# Patient Record
Sex: Male | Born: 1950 | Race: White | Hispanic: No | Marital: Single | Smoking: Current every day smoker
Health system: Southern US, Community
[De-identification: ages and names within clinical notes are randomized; demographics above are authoritative.]

## PROBLEM LIST (undated history)

## (undated) DIAGNOSIS — K219 Gastro-esophageal reflux disease without esophagitis: Secondary | ICD-10-CM

## (undated) DIAGNOSIS — E785 Hyperlipidemia, unspecified: Secondary | ICD-10-CM

## (undated) DIAGNOSIS — M549 Dorsalgia, unspecified: Secondary | ICD-10-CM

## (undated) DIAGNOSIS — J449 Chronic obstructive pulmonary disease, unspecified: Secondary | ICD-10-CM

## (undated) DIAGNOSIS — I10 Essential (primary) hypertension: Secondary | ICD-10-CM

## (undated) HISTORY — PX: HERNIA REPAIR: SHX51

---

## 2015-07-21 ENCOUNTER — Encounter: Payer: Self-pay | Admitting: *Deleted

## 2015-07-21 ENCOUNTER — Emergency Department
Admission: EM | Admit: 2015-07-21 | Discharge: 2015-07-21 | Disposition: A | Discharge status: Home / Self Care | Payer: Medicare Other | Attending: Emergency Medicine | Admitting: Emergency Medicine

## 2015-07-21 DIAGNOSIS — Y998 Other external cause status: Secondary | ICD-10-CM | POA: Insufficient documentation

## 2015-07-21 DIAGNOSIS — W1839XA Other fall on same level, initial encounter: Secondary | ICD-10-CM | POA: Insufficient documentation

## 2015-07-21 DIAGNOSIS — R42 Dizziness and giddiness: Secondary | ICD-10-CM | POA: Insufficient documentation

## 2015-07-21 DIAGNOSIS — I10 Essential (primary) hypertension: Secondary | ICD-10-CM | POA: Insufficient documentation

## 2015-07-21 DIAGNOSIS — Z720 Tobacco use: Secondary | ICD-10-CM | POA: Insufficient documentation

## 2015-07-21 DIAGNOSIS — Z880 Allergy status to penicillin: Secondary | ICD-10-CM | POA: Insufficient documentation

## 2015-07-21 DIAGNOSIS — Y9289 Other specified places as the place of occurrence of the external cause: Secondary | ICD-10-CM | POA: Insufficient documentation

## 2015-07-21 DIAGNOSIS — Y9389 Activity, other specified: Secondary | ICD-10-CM | POA: Insufficient documentation

## 2015-07-21 DIAGNOSIS — R531 Weakness: Secondary | ICD-10-CM | POA: Insufficient documentation

## 2015-07-21 DIAGNOSIS — S299XXA Unspecified injury of thorax, initial encounter: Secondary | ICD-10-CM | POA: Insufficient documentation

## 2015-07-21 HISTORY — DX: Gastro-esophageal reflux disease without esophagitis: K21.9

## 2015-07-21 HISTORY — DX: Chronic obstructive pulmonary disease, unspecified: J44.9

## 2015-07-21 HISTORY — DX: Essential (primary) hypertension: I10

## 2015-07-21 HISTORY — DX: Dorsalgia, unspecified: M54.9

## 2015-07-21 LAB — CBC
HCT: 47.3 % (ref 40.0–52.0)
Hemoglobin: 16 g/dL (ref 13.0–18.0)
MCH: 29.9 pg (ref 26.0–34.0)
MCHC: 33.9 g/dL (ref 32.0–36.0)
MCV: 88.3 fL (ref 80.0–100.0)
Platelets: 242 10*3/uL (ref 150–440)
RBC: 5.36 MIL/uL (ref 4.40–5.90)
RDW: 15.6 % — ABNORMAL HIGH (ref 11.5–14.5)
WBC: 11 10*3/uL — ABNORMAL HIGH (ref 3.8–10.6)

## 2015-07-21 LAB — BASIC METABOLIC PANEL
Anion gap: 12 (ref 5–15)
BUN: 17 mg/dL (ref 6–20)
CO2: 23 mmol/L (ref 22–32)
Calcium: 9.5 mg/dL (ref 8.9–10.3)
Chloride: 103 mmol/L (ref 101–111)
Creatinine, Ser: 0.83 mg/dL (ref 0.61–1.24)
GFR calc Af Amer: >60 mL/min (ref >60)
GFR calc non Af Amer: >60 mL/min (ref >60)
Glucose, Bld: 182 mg/dL — ABNORMAL HIGH (ref 65–99)
Potassium: 4.1 mmol/L (ref 3.5–5.1)
Sodium: 138 mmol/L (ref 135–145)

## 2015-07-21 LAB — TROPONIN I: Troponin I: <0.03 ng/mL (ref <0.031)

## 2015-07-21 MED ORDER — SODIUM CHLORIDE 0.9 % IV SOLN
Freq: Once | INTRAVENOUS | Status: AC
Start: 2015-07-21 — End: 2015-07-21
  Administered 2015-07-21: 20:00:00 via INTRAVENOUS

## 2015-07-21 NOTE — ED Notes (Signed)
Pt reports to ED with reports of dizziness, lightheadedness, chest pain, and episodes of falling since Saturday. Pt has unsteady gait with use of cane. Pt states hx of acid reflux, smoking 0.5 pack of cigarettes a day.

## 2015-07-21 NOTE — ED Notes (Signed)
States he feels like he is going to pass out since last pm, is homeless, diarrhea yesterday, chest heaviness

## 2015-07-21 NOTE — ED Notes (Signed)
Pt provided meal and drinks.

## 2015-07-21 NOTE — Discharge Instructions (Signed)

## 2015-07-21 NOTE — ED Provider Notes (Signed)
[facility] Emergency Department Provider Note     Time seen: ----------------------------------------- 7:52 PM on 07/21/2015 -----------------------------------------    I have reviewed the triage vital signs and the nursing notes.   HISTORY  Chief Complaint Near Syncope    HPI Kenneth Patel is a 64 y.o. male who presents ER with dizziness lightheadedness chest pain and falling since Saturday. Patient has unsteady gait with his history and use a cane. He is currently homeless, visiting from [place]. Has a history of acid reflux and smokes half pack of cigarettes a day. Patient also admits he has not eaten anything today.   Past Medical History  Diagnosis Date  . GERD (gastroesophageal reflux disease)   . Hypertension   . Back ache   . COPD (chronic obstructive pulmonary disease) (HCC)     There are no active problems to display for this patient.   Past Surgical History  Procedure Laterality Date  . Hernia repair      Allergies Penicillins  Social History Social History  Substance Use Topics  . Smoking status: Current Every Day Smoker  . Smokeless tobacco: None  . Alcohol Use: No    Review of Systems Constitutional: Negative for fever. Eyes: Negative for visual changes. ENT: Negative for sore throat. Cardiovascular: Positive for chest pain Respiratory: Negative for shortness of breath. Gastrointestinal: Negative for abdominal pain, vomiting and diarrhea. Genitourinary: Negative for dysuria. Musculoskeletal: Negative for back pain. Skin: Negative for rash. Neurological: Negative for headaches, positive for weakness  10-point ROS otherwise negative.  ____________________________________________   PHYSICAL EXAM:  VITAL SIGNS: ED Triage Vitals  Enc Vitals Group     BP 07/21/15 1857 126/109 mmHg     Pulse Rate 07/21/15 1857 99     Resp 07/21/15 1857 24     Temp 07/21/15 1857 98.2 F (36.8 C)     Temp Source 07/21/15 1857  Oral     SpO2 07/21/15 1857 96 %     Weight 07/21/15 1857 200 lb (90.719 kg)     Height 07/21/15 1857 5\' 6"  (1.676 m)     Head Cir --      Peak Flow --      Pain Score 07/21/15 1859 5     Pain Loc --      Pain Edu? --      Excl. in GC? --     Constitutional: Alert and oriented. Well appearing and in no distress. Eyes: Conjunctivae are normal. PERRL. Normal extraocular movements. ENT   Head: Normocephalic and atraumatic.   Nose: No congestion/rhinnorhea.   Mouth/Throat: Mucous membranes are moist.   Neck: No stridor. Cardiovascular: Normal rate, regular rhythm. Normal and symmetric distal pulses are present in all extremities. No murmurs, rubs, or gallops. Respiratory: Normal respiratory effort without tachypnea nor retractions. Breath sounds are clear and equal bilaterally. No wheezes/rales/rhonchi. Gastrointestinal: Soft and nontender. No distention. No abdominal bruits.  Musculoskeletal: Nontender with normal range of motion in all extremities. No joint effusions.  No lower extremity tenderness nor edema. Neurologic:  Normal speech and language. No gross focal neurologic deficits are appreciated. Speech is normal. No gait instability. Skin:  Skin is warm, dry and intact. No rash noted. Psychiatric: Mood and affect are normal. Speech and behavior are normal. Patient exhibits appropriate insight and judgment. ____________________________________________  EKG: Interpreted by me. Normal sinus rhythm with a rate of 95 bpm, normal PR interval, normal QS with, normal QT interval. Nonspecific T-wave changes.  ____________________________________________  ED COURSE:  Pertinent  labs & imaging results that were available during my care of the patient were reviewed by me and considered in my medical decision making (see chart for details). Patient is in no acute distress, will check basic labs, given a liter of saline and given something to  eat. ____________________________________________    LABS (pertinent positives/negatives)  Labs Reviewed  BASIC METABOLIC PANEL - Abnormal; Notable for the following:    Glucose, Bld 182 (*)    All other components within normal limits  CBC - Abnormal; Notable for the following:    WBC 11.0 (*)    RDW 15.6 (*)    All other components within normal limits  TROPONIN I  URINALYSIS COMPLETEWITH MICROSCOPIC ([facility] ONLY)  ____________________________________________  FINAL ASSESSMENT AND PLAN  Weakness  Plan: Patient with labs as dictated above. Patient was given saline and something to eat. Labs as dictated above. Patient is feeling better, will be discharged with a cab voucher to the [facility] shelter.   Emily Filbert, MD   Emily Filbert, MD 07/21/15 2100

## 2015-07-21 NOTE — ED Notes (Signed)

## 2015-07-22 ENCOUNTER — Encounter (HOSPITAL_COMMUNITY): Payer: Self-pay

## 2015-07-22 ENCOUNTER — Observation Stay (HOSPITAL_COMMUNITY)
Admission: EM | Admit: 2015-07-22 | Discharge: 2015-07-23 | Discharge status: Against Medical Advice | Payer: Medicare Other | Attending: Internal Medicine | Admitting: Internal Medicine

## 2015-07-22 ENCOUNTER — Emergency Department (HOSPITAL_COMMUNITY): Payer: Medicare Other

## 2015-07-22 DIAGNOSIS — E785 Hyperlipidemia, unspecified: Secondary | ICD-10-CM | POA: Diagnosis not present

## 2015-07-22 DIAGNOSIS — M549 Dorsalgia, unspecified: Secondary | ICD-10-CM | POA: Insufficient documentation

## 2015-07-22 DIAGNOSIS — K921 Melena: Secondary | ICD-10-CM | POA: Diagnosis not present

## 2015-07-22 DIAGNOSIS — G8929 Other chronic pain: Secondary | ICD-10-CM | POA: Insufficient documentation

## 2015-07-22 DIAGNOSIS — Z23 Encounter for immunization: Secondary | ICD-10-CM | POA: Insufficient documentation

## 2015-07-22 DIAGNOSIS — Z880 Allergy status to penicillin: Secondary | ICD-10-CM | POA: Diagnosis not present

## 2015-07-22 DIAGNOSIS — R0789 Other chest pain: Secondary | ICD-10-CM

## 2015-07-22 DIAGNOSIS — J449 Chronic obstructive pulmonary disease, unspecified: Secondary | ICD-10-CM | POA: Diagnosis not present

## 2015-07-22 DIAGNOSIS — F17200 Nicotine dependence, unspecified, uncomplicated: Secondary | ICD-10-CM

## 2015-07-22 DIAGNOSIS — J189 Pneumonia, unspecified organism: Secondary | ICD-10-CM | POA: Diagnosis not present

## 2015-07-22 DIAGNOSIS — K219 Gastro-esophageal reflux disease without esophagitis: Secondary | ICD-10-CM | POA: Insufficient documentation

## 2015-07-22 DIAGNOSIS — I10 Essential (primary) hypertension: Secondary | ICD-10-CM | POA: Diagnosis not present

## 2015-07-22 DIAGNOSIS — Z5900 Homelessness unspecified: Secondary | ICD-10-CM

## 2015-07-22 DIAGNOSIS — Z5321 Procedure and treatment not carried out due to patient leaving prior to being seen by health care provider: Secondary | ICD-10-CM

## 2015-07-22 HISTORY — DX: Hyperlipidemia, unspecified: E78.5

## 2015-07-22 LAB — CBC WITH DIFFERENTIAL/PLATELET
Basophils Absolute: 0 10*3/uL (ref 0.0–0.1)
Basophils Relative: 0 %
EOS ABS: 0.1 10*3/uL (ref 0.0–0.7)
Eosinophils Relative: 0 %
HEMATOCRIT: 46.1 % (ref 39.0–52.0)
HEMOGLOBIN: 15.1 g/dL (ref 13.0–17.0)
LYMPHS ABS: 2.2 10*3/uL (ref 0.7–4.0)
Lymphocytes Relative: 14 %
MCH: 28.9 pg (ref 26.0–34.0)
MCHC: 32.8 g/dL (ref 30.0–36.0)
MCV: 88.3 fL (ref 78.0–100.0)
MONOS PCT: 8 %
Monocytes Absolute: 1.3 10*3/uL — ABNORMAL HIGH (ref 0.1–1.0)
NEUTROS ABS: 12.6 10*3/uL — AB (ref 1.7–7.7)
NEUTROS PCT: 78 %
Platelets: 205 10*3/uL (ref 150–400)
RBC: 5.22 MIL/uL (ref 4.22–5.81)
RDW: 15.4 % (ref 11.5–15.5)
WBC: 16.3 10*3/uL — ABNORMAL HIGH (ref 4.0–10.5)

## 2015-07-22 LAB — HEPATIC FUNCTION PANEL
ALBUMIN: 3.6 g/dL (ref 3.5–5.0)
ALK PHOS: 74 U/L (ref 38–126)
ALT: 21 U/L (ref 17–63)
AST: 28 U/L (ref 15–41)
BILIRUBIN TOTAL: 0.6 mg/dL (ref 0.3–1.2)
Bilirubin, Direct: <0.1 mg/dL — ABNORMAL LOW (ref 0.1–0.5)
TOTAL PROTEIN: 6.1 g/dL — AB (ref 6.5–8.1)

## 2015-07-22 LAB — RAPID URINE DRUG SCREEN, HOSP PERFORMED
AMPHETAMINES: NOT DETECTED
BARBITURATES: NOT DETECTED
Benzodiazepines: NOT DETECTED
Cocaine: NOT DETECTED
Opiates: NOT DETECTED
TETRAHYDROCANNABINOL: NOT DETECTED

## 2015-07-22 LAB — TROPONIN I

## 2015-07-22 LAB — BASIC METABOLIC PANEL
ANION GAP: 9 (ref 5–15)
BUN: 14 mg/dL (ref 6–20)
CHLORIDE: 105 mmol/L (ref 101–111)
CO2: 24 mmol/L (ref 22–32)
CREATININE: 0.79 mg/dL (ref 0.61–1.24)
Calcium: 9.4 mg/dL (ref 8.9–10.3)
GFR calc non Af Amer: >60 mL/min (ref >60)
Glucose, Bld: 101 mg/dL — ABNORMAL HIGH (ref 65–99)
POTASSIUM: 4 mmol/L (ref 3.5–5.1)
SODIUM: 138 mmol/L (ref 135–145)

## 2015-07-22 LAB — PROCALCITONIN

## 2015-07-22 LAB — POC OCCULT BLOOD, ED: FECAL OCCULT BLD: NEGATIVE

## 2015-07-22 LAB — PHOSPHORUS: PHOSPHORUS: 2.8 mg/dL (ref 2.5–4.6)

## 2015-07-22 LAB — MAGNESIUM: MAGNESIUM: 2.1 mg/dL (ref 1.7–2.4)

## 2015-07-22 MED ORDER — DEXTROSE 5 % IV SOLN
1.0000 g | INTRAVENOUS | Status: DC
  Filled 2015-07-22: qty 10

## 2015-07-22 MED ORDER — INFLUENZA VAC SPLIT QUAD 0.5 ML IM SUSY
0.5000 mL | PREFILLED_SYRINGE | INTRAMUSCULAR | Status: DC
  Filled 2015-07-22: qty 0.5

## 2015-07-22 MED ORDER — DEXTROSE 5 % IV SOLN
500.0000 mg | INTRAVENOUS | Status: DC
  Filled 2015-07-22: qty 500

## 2015-07-22 MED ORDER — ACETAMINOPHEN 325 MG PO TABS
650.0000 mg | ORAL_TABLET | Freq: Four times a day (QID) | ORAL | Status: DC | PRN
  Administered 2015-07-22: 650 mg via ORAL
  Filled 2015-07-22: qty 2

## 2015-07-22 MED ORDER — PANTOPRAZOLE SODIUM 40 MG PO TBEC
40.0000 mg | DELAYED_RELEASE_TABLET | Freq: Two times a day (BID) | ORAL | Status: DC
  Administered 2015-07-22 – 2015-07-23 (×3): 40 mg via ORAL
  Filled 2015-07-22 (×3): qty 1

## 2015-07-22 MED ORDER — DEXTROSE 5 % IV SOLN
500.0000 mg | Freq: Once | INTRAVENOUS | Status: AC
  Administered 2015-07-22: 500 mg via INTRAVENOUS
  Filled 2015-07-22: qty 500

## 2015-07-22 MED ORDER — PNEUMOCOCCAL VAC POLYVALENT 25 MCG/0.5ML IJ INJ
0.5000 mL | INJECTION | INTRAMUSCULAR | Status: DC
  Filled 2015-07-22 (×2): qty 0.5

## 2015-07-22 MED ORDER — ACETAMINOPHEN 650 MG RE SUPP: 650.0000 mg | Freq: Four times a day (QID) | RECTAL | Status: DC | PRN

## 2015-07-22 MED ORDER — VITAMIN B-1 100 MG PO TABS
100.0000 mg | ORAL_TABLET | Freq: Every day | ORAL | Status: DC
  Administered 2015-07-22: 100 mg via ORAL
  Filled 2015-07-22: qty 1

## 2015-07-22 MED ORDER — CEFTRIAXONE SODIUM 1 G IJ SOLR
1.0000 g | Freq: Once | INTRAMUSCULAR | Status: AC
  Administered 2015-07-22: 1 g via INTRAVENOUS
  Filled 2015-07-22: qty 10

## 2015-07-22 MED ORDER — FOLIC ACID 1 MG PO TABS
1.0000 mg | ORAL_TABLET | Freq: Every day | ORAL | Status: DC
  Administered 2015-07-22: 1 mg via ORAL
  Filled 2015-07-22: qty 1

## 2015-07-22 MED ORDER — ENOXAPARIN SODIUM 40 MG/0.4ML ~~LOC~~ SOLN
40.0000 mg | SUBCUTANEOUS | Status: DC
  Administered 2015-07-22: 40 mg via SUBCUTANEOUS
  Filled 2015-07-22: qty 0.4

## 2015-07-22 MED ORDER — ALBUTEROL SULFATE (2.5 MG/3ML) 0.083% IN NEBU: 2.5000 mg | INHALATION_SOLUTION | RESPIRATORY_TRACT | Status: DC | PRN

## 2015-07-22 MED ORDER — ADULT MULTIVITAMIN W/MINERALS CH
1.0000 | ORAL_TABLET | Freq: Every day | ORAL | Status: DC
  Administered 2015-07-22 – 2015-07-23 (×2): 1 via ORAL
  Filled 2015-07-22 (×2): qty 1

## 2015-07-22 NOTE — Progress Notes (Signed)
When turning the bed alarm on for the patient, the patient stated he did not want the bed alarm on. Patient was educated about the importance of the bed alarm in preventing him from falling and instructed to use the call bell when needing the nurse. Patient continued to refuse the bed alarm.  Bed alarm off at this time.  Will continue to monitor.

## 2015-07-22 NOTE — ED Notes (Addendum)
Pt. Ambulated in hallway with assistance of cane. Pt. O2 sat. Stayed at 90% during ambulation. Pt. Hooked back to monitor.

## 2015-07-22 NOTE — ED Provider Notes (Signed)
CSN: [id number]     Arrival date & time 07/22/15  0704 History   First MD Initiated Contact with Patient 07/22/15 [phone]     Chief Complaint  Patient presents with  . Back Pain  . Blood In Stools     (Consider location/radiation/quality/duration/timing/severity/associated sxs/prior Treatment) HPI Comments: Patient with past medical history of chronic low back pain presents to the emergency department with chief complaint of low back pain and blood in stools.  He also states that he has been aching "all over for the past couple days." Patient states that he is traveling from [place] to [place]. He was seen [facility]. He was discharged to a homeless shelter. Patient states that he noticed a small amount of blood in his underwear this morning. He states that he recently got over having diarrhea. He denies any other associated symptoms.  He denies any bowel or bladder incontinence. Denies saddle anesthesia, or weakness. He states that he has a back doctor in [place].  The history is provided by the patient. No language interpreter was used.    Past Medical History  Diagnosis Date  . GERD (gastroesophageal reflux disease)   . Hypertension   . Back ache   . COPD (chronic obstructive pulmonary disease) (HCC)   . Hyperlipidemia    Past Surgical History  Procedure Laterality Date  . Hernia repair     History reviewed. No pertinent family history. Social History  Substance Use Topics  . Smoking status: Current Every Day Smoker  . Smokeless tobacco: None  . Alcohol Use: No    Review of Systems  Constitutional: Negative for fever and chills.  Gastrointestinal:       No bowel incontinence  Genitourinary:       No urinary incontinence  Musculoskeletal: Positive for myalgias, back pain and arthralgias.  Neurological:       No saddle anesthesia  All other systems reviewed and are negative.     Allergies  Penicillins  Home Medications   Prior to  Admission medications   Not on File   BP 142/95 mmHg  Pulse 78  Temp(Src) 97.9 F (36.6 C) (Oral)  Ht 5\' 6"  (1.676 m)  Wt 200 lb (90.719 kg)  BMI 32.30 kg/m2  SpO2 91% Physical Exam  Constitutional: He is oriented to person, place, and time. He appears well-developed and well-nourished. No distress.  HENT:  Head: Normocephalic and atraumatic.  Eyes: Conjunctivae and EOM are normal. Right eye exhibits no discharge. Left eye exhibits no discharge. No scleral icterus.  Neck: Normal range of motion. Neck supple. No tracheal deviation present.  Cardiovascular: Normal rate, regular rhythm and normal heart sounds.  Exam reveals no gallop and no friction rub.   No murmur heard. Pulmonary/Chest: Effort normal and breath sounds normal. No respiratory distress. He has no wheezes.  Abdominal: Soft. He exhibits no distension. There is no tenderness.  Genitourinary:  Normal rectal tone on exam, no gross blood Perineal yeast infection Chaperone present  Musculoskeletal: Normal range of motion.  Lumbar paraspinal muscles tender to palpation, no bony tenderness, step-offs, or gross abnormality or deformity of spine, patient is able to ambulate, moves all extremities  Bilateral great toe extension intact Bilateral plantar/dorsiflexion intact  Neurological: He is alert and oriented to person, place, and time.  Sensation and strength intact bilaterally   Skin: Skin is warm. He is not diaphoretic.  Psychiatric: He has a normal mood and affect. His behavior is normal. Judgment and thought content  normal.  Nursing note and vitals reviewed.   ED Course  Procedures (including critical care time)   MDM   Final diagnoses:  CAP (community acquired pneumonia)   Patient with chronic low back pain.  No new symptoms.  Ambulatory.  No sign of cauda equina.  FOBT test is negative.  Normal hemoglobin.  He also has a yeast infection.  Will treat with diflucan.    Patient updated of results. Hemoccult  negative. Patient asks if he can sleep here today.  Will give patient resource guide.  Patient O2 sat is lower than normal.  No CXR last night, will check this here.    9:32 AM CXR remarkable for probable pneumonia in bilateral lower lobes.  Will treat with rocephin and azithro.  Mild leukocytosis from last night.  Patient discussed with Dr. Rubin Payor.  Patient will require admission.  Roxy Horseman, PA-C 07/22/15 1451  Benjiman Core, MD 07/23/15 1622

## 2015-07-22 NOTE — Progress Notes (Signed)
Received report from Erin in ED patient will be coming in 15-30 minutes to unit

## 2015-07-22 NOTE — Progress Notes (Signed)
MD at bedside. 

## 2015-07-22 NOTE — ED Notes (Addendum)
Pt. O2 sat. In low 90's and upper 80's. Placed Pt. On 2L [place].

## 2015-07-22 NOTE — ED Notes (Signed)
Pt. Hx of chronic back pain. Pt. States that his lower back pain has worsened starting yesterday. Pt. States he just got over diarrhea recently and noticed today blood in his underwear.

## 2015-07-22 NOTE — Progress Notes (Signed)
Patient is complaining of right sided pain.  IM resident paged.  Patient given tylenol to help with the pain.  Patient has been sleeping most of the day when woken up he began complaining of this right chest pain

## 2015-07-22 NOTE — H&P (Signed)
Date: 07/22/2015               Patient Name:  Kenneth Patel MRN: [id number]  DOB: 1951/04/21 Age / Sex: 64 y.o., male   PCP: No primary care provider on file.           Medical Service: Internal Medicine Teaching Service         Attending Physician: Dr. Earl Lagos, MD    First Contact: Dr. Allena Katz, MD Pager: [phone]  Second Contact: Dr. Tasia Catchings, MD Pager: [phone]       After Hours (After 5p/  First Contact Pager: [phone]  weekends / holidays): Second Contact Pager: [phone]    Most Recent Discharge Date:  07/21/15  Chief Complaint:  Chief Complaint  Patient presents with  . Back Pain  . Blood In Stools       History of Present Illness:  Kenneth Patel is a 64 y.o. male who has a past medical history of GERD (gastroesophageal reflux disease); Hypertension; Back ache; COPD (chronic obstructive pulmonary disease) (HCC); and Hyperlipidemia.Marland Kitchen    Pt presents to the ED with vague complaints of "feeling bad" for the past couple of days.  He is a rather poor historian and endorses memory loss.  But he reports going to the ED at [facility] for chest pain yesterday and was discharged after negative trop and unremarkable EKG.  It is hard to figure out exactly what his complaint is but upon ROS he endorses chest pain worse with cough, increased whitish sputum production, and dyspnea.  He has a h/o COPD and reports he is "always short of breath" and used to be on 4L of home oxygen.  He denies fever but has chills and body aches.  Denies any night sweats, hemoptysis, or weight loss.  Endorses memory loss.  Denies any h/o alcohol or drug use.  Denies any psychiatric history.  Smokes 1/2 ppd since he was 63 yrs old.  Social history includes being homeless currently sleeping on park benches.  States he was in [place] where he was visiting but is living in [place] where he has an appt.  He was apparently in [place] where he reports being hospitalized for an unknown reason and stayed in the ED for 3  days.  Denies any h/o TB or hepatitis.  Has been in prison previously but does not want to talk about that.  Also was in the [employer] previously.  Used to work [employer] and has grown children that don't want anything to do with him.    In the ED, pt was CXR revealed coarse interstitial densities at both lung bases worrisome for PNA.  He has a mild leukocytosis of 16,000 up from 11,000 yesterday.  He was started on cetriaxone and azithromycin.    Meds: Current Facility-Administered Medications  Medication Dose Route Frequency Provider Last Rate Last Dose  . acetaminophen (TYLENOL) tablet 650 mg  650 mg Oral Q6H PRN Marrian Salvage, MD       Or  . acetaminophen (TYLENOL) suppository 650 mg  650 mg Rectal Q6H PRN Marrian Salvage, MD      . albuterol (PROVENTIL) (2.5 MG/3ML) 0.083% nebulizer solution 2.5 mg  2.5 mg Nebulization Q2H PRN Marrian Salvage, MD      . Melene Muller ON 07/23/2015] azithromycin (ZITHROMAX) 500 mg in dextrose 5 % 250 mL IVPB  500 mg Intravenous Q24H Marrian Salvage, MD      . Melene Muller ON 07/23/2015] cefTRIAXone (ROCEPHIN) 1 g in dextrose  5 % 50 mL IVPB  1 g Intravenous Q24H Nischal Narendra, MD      . enoxaparin (LOVENOX) injection 40 mg  40 mg Subcutaneous Q24H Marrian SalvageJacquelyn S Shaliyah Taite, MD      . Melene Muller[START ON 07/23/2015] Influenza vac split quadrivalent PF (FLUARIX) injection 0.5 mL  0.5 mL Intramuscular Tomorrow-1000 Nischal Narendra, MD      . multivitamin with minerals tablet 1 tablet  1 tablet Oral Daily Marrian SalvageJacquelyn S Amaura Authier, MD   1 tablet at 07/22/15 1311  . pantoprazole (PROTONIX) EC tablet 40 mg  40 mg Oral BID Marrian SalvageJacquelyn S Makaila Windle, MD      . Melene Muller[START ON 07/23/2015] pneumococcal 23 valent vaccine (PNU-IMMUNE) injection 0.5 mL  0.5 mL Intramuscular Tomorrow-1000 Nischal Narendra, MD        No prescriptions prior to admission    Allergies: Allergies as of 07/22/2015 - Review Complete 07/22/2015  Allergen Reaction Noted  . Penicillins Hives 07/21/2015    PMH: Past Medical History    Diagnosis Date  . GERD (gastroesophageal reflux disease)   . Hypertension   . Back ache   . COPD (chronic obstructive pulmonary disease) (HCC)   . Hyperlipidemia     PSH: Past Surgical History  Procedure Laterality Date  . Hernia repair      FH: History reviewed. No pertinent family history.  SH: Social History  Substance Use Topics  . Smoking status: Current Every Day Smoker  . Smokeless tobacco: Not on file  . Alcohol Use: No    Review of Systems: Pertinent items are noted in HPI.  Physical Exam: BP 113/68 mmHg  Pulse 79  Temp(Src) 97.8 F (36.6 C) (Oral)  Resp 22  Ht 5\' 6"  (1.676 m)  Wt 200 lb (90.719 kg)  BMI 32.30 kg/m2  SpO2 97%  Physical Exam  Constitutional: Vital signs reviewed.  Patient is a disheveled appearing male lying in bed in NAD.    Head: Normocephalic and atraumatic. Face appeared flushed.   Eyes: EOMI, conjunctivae injected. Neck: Supple. Mouth: Abnormal tongue movements.  Cardiovascular: RRR, no MRG. Pulmonary/Chest: normal respiratory effort, CTAB, no wheezes, rales, or rhonchi.  Tenderness to palpation.   Abdominal: Soft. Epigastric tenderness to palpation.  Non-distended, bowel sounds are normal. Neurological: A&O x2, was unable to state the year, cranial nerve II-XII are grossly intact, moving all extremities.   Skin: Warm, dry and intact.  Nails soiled.   Genital: Deferred.  Psychiatric: Guarded.   Lab results:  Basic Metabolic Panel:  Recent Labs  16/06/9609/31/16 1909 07/22/15 0942  NA 138 138  K 4.1 4.0  CL 103 105  CO2 23 24  GLUCOSE 182* 101*  BUN 17 14  CREATININE 0.83 0.79  CALCIUM 9.5 9.4    Calcium/Magnesium/Phosphorus:  Recent Labs Lab 07/21/15 1909 07/22/15 0942  CALCIUM 9.5 9.4    Liver Function Tests:  Recent Labs  07/22/15 0925  AST 28  ALT 21  ALKPHOS 74  BILITOT 0.6  PROT 6.1*  ALBUMIN 3.6   No results for input(s): LIPASE, AMYLASE in the last 72 hours. No results for input(s): AMMONIA in  the last 72 hours.  CBC: Lab Results  Component Value Date   WBC 16.3* 07/22/2015   HGB 15.1 07/22/2015   HCT 46.1 07/22/2015   MCV 88.3 07/22/2015   PLT 205 07/22/2015    Lipase: No results found for: LIPASE  Lactic Acid/Procalcitonin:  Recent Labs Lab 07/22/15 0925  PROCALCITON <0.10    Cardiac Enzymes: No results for input(s): TROPIPOC  in the last 72 hours. Lab Results  Component Value Date   TROPONINI <0.03 07/21/2015    BNP: No results for input(s): PROBNP in the last 72 hours.  D-Dimer: No results for input(s): DDIMER in the last 72 hours.  CBG: No results for input(s): GLUCAP in the last 72 hours.  Hemoglobin A1C: No results for input(s): HGBA1C in the last 72 hours.  Lipid Panel: No results for input(s): CHOL, HDL, LDLCALC, TRIG, CHOLHDL, LDLDIRECT in the last 72 hours.  Thyroid Function Tests: No results for input(s): TSH, T4TOTAL, FREET4, T3FREE, THYROIDAB in the last 72 hours.  Anemia Panel: No results for input(s): VITAMINB12, FOLATE, FERRITIN, TIBC, IRON, RETICCTPCT in the last 72 hours.  Coagulation: No results for input(s): LABPROT, INR in the last 72 hours.  Urine Drug Screen: Drugs of Abuse:     Component Value Date/Time   LABOPIA NONE DETECTED 07/22/2015 1150   COCAINSCRNUR NONE DETECTED 07/22/2015 1150   LABBENZ NONE DETECTED 07/22/2015 1150   AMPHETMU NONE DETECTED 07/22/2015 1150   THCU NONE DETECTED 07/22/2015 1150   LABBARB NONE DETECTED 07/22/2015 1150    Alcohol Level: No results for input(s): ETH in the last 72 hours.  Urinalysis: No results found for: COLORURINE, APPEARANCEUR, LABSPEC, PHURINE, GLUCOSEU, HGBUR, BILIRUBINUR, KETONESUR, PROTEINUR, UROBILINOGEN, NITRITE, LEUKOCYTESUR  Imaging results:  Dg Chest 2 View  07/22/2015  CLINICAL DATA:  Two days of cough and weakness associated with dysuria which began 1 day ago; history of COPD, current smoker EXAM: CHEST  2 VIEW COMPARISON:  None in PACs FINDINGS: The lungs  are adequately inflated. There are coarse interstitial infiltrates in the infrahilar regions bilaterally. There are no air bronchograms. There is no pleural effusion. The heart and pulmonary vascularity are normal. There is tortuosity of the descending thoracic aorta. IMPRESSION: Coarse interstitial densities at both lung bases worrisome for pneumonia. Followup PA and lateral chest X-ray is recommended in 3-4 weeks following trial of antibiotic therapy to ensure resolution and exclude underlying malignancy. Electronically Signed   By: David  [place] M.D.   On: 07/22/2015 09:14    EKG: EKG Interpretation  Date/Time:    Ventricular Rate:    PR Interval:    QRS Duration:   QT Interval:    QTC Calculation:   R Axis:     Text Interpretation:     Antibiotics: Antibiotics Given (last 72 hours)    None      Anti-infectives    Start     Dose/Rate Route Frequency Ordered Stop   07/23/15 1000  azithromycin (ZITHROMAX) 500 mg in dextrose 5 % 250 mL IVPB     500 mg 250 mL/hr over 60 Minutes Intravenous Every 24 hours 07/22/15 1253     07/23/15 1000  cefTRIAXone (ROCEPHIN) 1 g in dextrose 5 % 50 mL IVPB     1 g 100 mL/hr over 30 Minutes Intravenous Every 24 hours 07/22/15 1301     07/22/15 0930  cefTRIAXone (ROCEPHIN) 1 g in dextrose 5 % 50 mL IVPB     1 g 100 mL/hr over 30 Minutes Intravenous  Once 07/22/15 0923 07/22/15 1115   07/22/15 0930  azithromycin (ZITHROMAX) 500 mg in dextrose 5 % 250 mL IVPB     500 mg 250 mL/hr over 60 Minutes Intravenous  Once 07/22/15 1610 07/22/15 1235     Consults:  None   Assessment & Plan by Problem: Principal Problem:   CAP (community acquired pneumonia) Active Problems:   GERD (gastroesophageal reflux disease)  Homelessness   Chronic back pain   COPD (chronic obstructive pulmonary disease) (HCC)   Dyslipidemia  CAP Pt with findings of PNA on CXR.  Pt reports increased sputum, cough, and dyspnea.  Lungs were CTAB.  Had leukocytosis and was  hypoxic in the ED to 84% and was placed on O2.  No hemoptysis, weight loss, or night sweats making TB less likely although at high risk.  Possibly viral PNA.   -cont azithromycin and ceftriaxone -sputum and blood cx -strep and legionella antigen -RVP  -O2 to keep SpO2 between 88-92% -PCT pending  -check pulse ox with ambulation in the AM   Atypical chest pain Likely related to PNA with increased cough and chest wall tenderness.  Trop neg and EKG unremarkable. -tylenol PRN   COPD Does not appear to be in exacerbation.   -albuterol PRN -keep SpO2 88-92%  GERD Epigastric tenderness and chest pain.  -protonix bid   Substance abuse  Pt has a h/o tobacco use. -check Mg, phos  -MVI -nicotine patch  daily -CSW consult  Homelessness -CSW consult   FEN  Fluids-None  Electrolytes-Replete as needed Nutrition- Regular  Screening  -check HIV, HepC Ab  VTE prophylaxis  lovenox  SQ qd  Disposition Disposition deferred at this time, awaiting improvement of current medical problems. Anticipated discharge in approximately 1-2 day(s).   -consult care management for medications  -consult social work for homelessness   Emergency Contact Contact Information    Name Relation Home Work Mobile   No,Contact  [phone]        The patient does not have a current PCP (No primary care provider on file.) and does need an [facility] hospital follow-up appointment after discharge.  Signed Marrian Salvage, MD PGY-3, Internal Medicine Teaching Service 07/22/2015, 3:39 PM

## 2015-07-22 NOTE — Progress Notes (Signed)
New Admission Note:  Arrival Method:  Ed stretcher Mental Orientation: alert and oriented x4 Telemetry: none Assessment: Completed Skin: looks good no skin tares or pressure ulcers noted IV: present from ED L AC  Pain: 7 chronic back pain   Tubes:0 Safety Measures: Safety Fall Prevention Plan was given, discussed and signed. Admission: Completed [address] [place] Orientation: Patient has been orientated to the room, unit and the staff. Family: none present  Orders have been reviewed and implemented. Will continue to monitor the patient. Call light has been placed within reach and bed alarm has been activated.   Tonna CornerJessica Edwards, RN  Phone Number: [phone]

## 2015-07-22 NOTE — Progress Notes (Signed)
ANTIBIOTIC CONSULT NOTE - INITIAL  Pharmacy Consult for Ceftriaxone Indication: pneumonia (CAP)  Allergies  Allergen Reactions  . Penicillins Hives    Patient Measurements: Height: 5\' 6"  (167.6 cm) Weight: 200 lb (90.719 kg) IBW/kg (Calculated) : 63.8  Vital Signs: Temp: 97.8 F (36.6 C) (11/01 1248) Temp Source: Oral (11/01 1248) BP: 113/68 mmHg (11/01 1248) Pulse Rate: 79 (11/01 1248) Intake/Output from previous day:   Intake/Output from this shift: Total I/O In: 480 [P.O.:480] Out: 300 [Urine:300]  Labs:  Recent Labs  07/21/15 1909 07/22/15 0942  WBC 11.0* 16.3*  HGB 16.0 15.1  PLT 242 205  CREATININE 0.83 0.79   ID:  Abx:  CTX 10/31 <,  Azithro: 10/31 <<   Cx data:  11/1 blood: ngtd   Assessment: 64yoM admitted with potential CAP  Goal of Therapy:  treatment of infection   Plan:  Continue Ceftriaxone 1 gm IV q24H Follow up culture results and narrow abx as feasible Will s/o; please re consult in needed   Pollyann SamplesAndy Chaya Dehaan, PharmD, BCPS 07/22/2015, 1:11 PM Pager: [phone]

## 2015-07-23 DIAGNOSIS — J189 Pneumonia, unspecified organism: Secondary | ICD-10-CM | POA: Diagnosis not present

## 2015-07-23 LAB — RESPIRATORY VIRUS PANEL
ADENOVIRUS: NEGATIVE
INFLUENZA B 1: NEGATIVE
Influenza A: NEGATIVE
METAPNEUMOVIRUS: NEGATIVE
PARAINFLUENZA 1 A: NEGATIVE
PARAINFLUENZA 2 A: NEGATIVE
Parainfluenza 3: NEGATIVE
Respiratory Syncytial Virus A: NEGATIVE
Respiratory Syncytial Virus B: NEGATIVE
Rhinovirus: POSITIVE — AB

## 2015-07-23 LAB — HEPATITIS PANEL, ACUTE
HEP A IGM: NEGATIVE
HEP B S AG: NEGATIVE
Hep B C IgM: NEGATIVE

## 2015-07-23 LAB — HIV ANTIBODY (ROUTINE TESTING W REFLEX): HIV Screen 4th Generation wRfx: NONREACTIVE

## 2015-07-23 LAB — STREP PNEUMONIAE URINARY ANTIGEN: Strep Pneumo Urinary Antigen: NEGATIVE

## 2015-07-23 MED ORDER — DEXTROSE 5 % IV SOLN: 1.0000 g | INTRAVENOUS | Status: AC

## 2015-07-23 MED ORDER — PANTOPRAZOLE SODIUM 40 MG PO TBEC: 40.0000 mg | DELAYED_RELEASE_TABLET | Freq: Two times a day (BID) | ORAL | Status: AC

## 2015-07-23 MED ORDER — DEXTROSE 5 % IV SOLN: 500.0000 mg | INTRAVENOUS | Status: AC

## 2015-07-23 NOTE — Care Management Note (Addendum)
Case Management Note  Patient Details  Name: Kenneth Patel MRN: [id number] Date of Birth: [birth year]  Subjective/Objective:                 Attempt to see patient, not in room.  Patient has left AMA per nursing note.   Action/Plan:   Expected Discharge Date:                  Expected Discharge Plan:     In-House Referral:     Discharge planning Services     Post Acute Care Choice:    Choice offered to:     DME Arranged:    DME Agency:     HH Arranged:    HH Agency:     Status of Service:     Medicare Important Message Given:    Date Medicare IM Given:    Medicare IM give by:    Date Additional Medicare IM Given:    Additional Medicare Important Message give by:     If discussed at Long Length of Stay Meetings, dates discussed:    Additional Comments:  Kenneth SabalDebbie Valen Mascaro, RN 07/23/2015, 11:20 AM

## 2015-07-23 NOTE — Discharge Summary (Signed)
Patient left AMA, refused home antibiotics.  Name: Kenneth Patel MRN: [id number] DOB: 1950-12-03 64 y.o. PCP: No primary care provider on file.  Date of Admission: 07/22/2015  7:05 AM Date of Discharge: 07/23/2015 Attending Physician: No att. providers found  Discharge Diagnosis: 1. CAP Principal Problem:   CAP (community acquired pneumonia) Active Problems:   GERD (gastroesophageal reflux disease)   Homelessness   Chronic back pain   COPD (chronic obstructive pulmonary disease) (HCC)   Dyslipidemia  Discharge Medications:   Medication List    TAKE these medications        azithromycin 500 mg in dextrose 5 % 250 mL  Inject 500 mg into the vein daily.     cefTRIAXone 1 g in dextrose 5 % 50 mL  Inject 1 g into the vein daily.     pantoprazole 40 MG tablet  Commonly known as:  PROTONIX  Take 1 tablet (40 mg total) by mouth 2 (two) times daily.        Disposition and follow-up:   Mr.Kenneth Patel left AMA. 1. CAP  2.  Labs / imaging needed at time of follow-up: none  3.  Pending labs/ test needing follow-up: none  Follow-up Appointments:   Discharge Instructions:   Consultations:    Procedures Performed:  Dg Chest 2 View  07/22/2015  CLINICAL DATA:  Two days of cough and weakness associated with dysuria which began 1 day ago; history of COPD, current smoker EXAM: CHEST  2 VIEW COMPARISON:  None in PACs FINDINGS: The lungs are adequately inflated. There are coarse interstitial infiltrates in the infrahilar regions bilaterally. There are no air bronchograms. There is no pleural effusion. The heart and pulmonary vascularity are normal. There is tortuosity of the descending thoracic aorta. IMPRESSION: Coarse interstitial densities at both lung bases worrisome for pneumonia. Followup PA and lateral chest X-ray is recommended in 3-4 weeks following trial of antibiotic therapy to ensure resolution and exclude underlying malignancy. Electronically Signed   By: David   Swaziland[place] M.D.   On: 07/22/2015 09:14    2D Echo: n/a  Cardiac Cath: n/a  Admission HPI: Kenneth RomeKenneth Patel is a 64 y.o. male who has a past medical history of GERD (gastroesophageal reflux disease); Hypertension; Back ache; COPD (chronic obstructive pulmonary disease) (HCC); and Hyperlipidemia.Marland Kitchen.   Pt presents to the ED with vague complaints of "feeling bad" for the past couple of days. He is a rather poor historian and endorses memory loss. But he reports going to the ED at [facility] for chest pain yesterday and was discharged after negative trop and unremarkable EKG. It is hard to figure out exactly what his complaint is but upon ROS he endorses chest pain worse with cough, increased whitish sputum production, and dyspnea. He has a h/o COPD and reports he is "always short of breath" and used to be on 4L of home oxygen. He denies fever but has chills and body aches. Denies any night sweats, hemoptysis, or weight loss. Endorses memory loss. Denies any h/o alcohol or drug use. Denies any psychiatric history. Smokes 1/2 ppd since he was 64 yrs old. Social history includes being homeless currently sleeping on park benches. States he was in [place] where [place] was visiting but [place] living [place] he has an appt. He was apparently in [place] where he reports being hospitalized for an unknown reason and stayed in the ED for 3 days. Denies any h/o TB or hepatitis. Has been in prison previously but does not  want to talk about that. Also was in the [employer]ines previously. Used to work [employer] representative and has grown children that don't want anything to do with him.   In the ED, pt was CXR revealed coarse interstitial densities at both lung bases worrisome for PNA. He has a mild leukocytosis of 16,000 up from 11,000 yesterday. He was started on cetriaxone and azithromycin.   Hospital Course by problem list: Principal Problem:   CAP (community acquired pneumonia) Active Problems:   GERD  (gastroesophageal reflux disease)   Homelessness   Chronic back pain   COPD (chronic obstructive pulmonary disease) (HCC)   Dyslipidemia   1. CAP - We were in the process of treating patient for CAP with antibiotics when patient left AMA and refused further inpatient treatment or home antibiotics.  Discharge Vitals:   BP 130/91 mmHg  Pulse 59  Temp(Src) 98.2 F (36.8 C) (Oral)  Resp 20  Ht  (1.676 m)  Wt 200 lb (90.719 kg)  BMI 32.30 kg/m2  SpO2 94%  Discharge Labs:  Results for orders placed or performed during the hospital encounter of 07/22/15 (from the past 24 hour(s))  Troponin I     Status: None   Collection Time: 07/22/15  8:20 PM  Result Value Ref Range   Troponin I <0.03 <0.031 ng/mL  Strep pneumoniae urinary antigen     Status: None   Collection Time: 07/23/15 12:39 AM  Result Value Ref Range   Strep Pneumo Urinary Antigen NEGATIVE NEGATIVE    Signed: Darreld Mclean, MD 07/23/2015, 11:50 AM    Services Ordered on Discharge: none Equipment Ordered on Discharge: none

## 2015-07-23 NOTE — Progress Notes (Signed)
Patient persistently asking about leaving hospital. Despite extensive encouragement to stay in the hospital and receive ordered treatments, pt states, "I don't want any medicine or anything else, I just want to go." Pt alert and oriented and able to make his own decisions. Pleasant and cooperative as well, but adamant about leaving the hospital. Signed AMA form and Dr. Allena KatzPatel and teaching service notified.

## 2015-07-23 NOTE — Progress Notes (Signed)
   Subjective: Patient left AMA, refused home antibiotics.      Darreld McleanVishal Patel, MD 07/23/2015, 9:09 AM

## 2015-07-23 NOTE — Progress Notes (Signed)
[phone]: Paged TS 1st pager number regarding patient requesting to leave.  [phone]: Paged 2nd pager number.

## 2015-07-24 LAB — LEGIONELLA PNEUMOPHILA SEROGP 1 UR AG: L. pneumophila Serogp 1 Ur Ag: NEGATIVE

## 2015-07-27 LAB — CULTURE, BLOOD (ROUTINE X 2)
CULTURE: NO GROWTH
Culture: NO GROWTH

## 2015-10-25 LAB — URINALYSIS
Bilirubin, Urine: NEGATIVE NA
Ketones, Urine: NEGATIVE mg/dL
LEUKOCYTES, UA: NEGATIVE NA
Nitrite, Urine: NEGATIVE NA
Occult Blood,Urine: NEGATIVE {RBC}/uL
Specific Gravity, Urine: 1.005 NA (ref 1.005–1.030)
Total Protein, Urine: 25 mg/dL
pH, Urine: 7 NA (ref 5.0–8.0)

## 2015-10-25 LAB — URINALYSIS WITH MICROSCOPIC
Epithelial Cells, Wet Prep: NEGATIVE /HPF (ref 3–5)
RBC, UA: NEGATIVE /HPF (ref 0–2)
Urine Volume: 12 NA

## 2015-10-25 NOTE — Other (Unsigned)
Patient Acct Nbr: [id number]   Primary AUTH/CERT:   Primary Insurance Company Name: [employer]  Primary Insurance Plan name: Medicare A  Primary Insurance Group Number:   Primary Insurance Plan Type: [employer] A  Primary Insurance Policy Number: [id number]    Secondary AUTH/CERT:   Secondary Insurance Company Name: [employer]  Secondary Insurance Plan name: Medicare B  Secondary Insurance Group Number:   Secondary Insurance Plan Type: [employer] B  Secondary Insurance Policy Number: [id number]

## 2015-10-25 NOTE — ED Provider Notes (Signed)
PATIENT:          Jesse Harris, Jesse Harris       DOS:           10/25/2015  MR #:             [id number]             ACCOUNT #:     [id number]  DATE OF BIRTH:    05-24-1951              AGE:           65      HISTORY OF PRESENT ILLNESS:    PERTINENT HISTORY OF PRESENT  ILLNESS. HPI: Patient arrives in  emergency  department complaining of severe low back pain after being forced to  sleep on a  mat in the shelter last night. He relates a long history of back  problems for  many years. He denies any weakness or numbness of his lower  extremities and  denies any urinary symptoms or incontinence. Also states that he has  had a  long-standing history of hypertension but has not taken any of his  medications  recently, since he is homeless.    ROS:  All 10 systems were reviewed and are negative except for those  mentioned in the  history of present illness.    MEDICATIONS: Reviewed in Nursing Notes    ALLERGIES:  Reviewed in Nursing Notes    SOCIAL HISTORY: denies alcohol or drug use    FAMILY HISTORY: Non-contributory    PAST MEDICAL HISTORY: Reviewed in Nursing Notes    PERTINENT PAST/ FAMILY/SOCIAL HISTORY Chronic low back pain,  hypertension        PHYSICAL EXAM GENERAL:  The patient appears uncomfortable but  answering  questions appropriately with clear speech.. Vital signs as  documented.    EYES:   Head exam is unremarkable. No scleral icterus or orbital  trauma noted.    HEENT:  Mucous membranes moist. Nares patent without copious  rhinorrhea.  No  enlarged lymphadenopathy.    LUNGS:  Lungs are clear to auscultation, without any respiratory  distress.    CARDIAC:   Rhythm is regular. No tachycardia or murmurs.    ABDOMEN:  Nontender with no obvious masses, and no peritoneal signs.    EXTREMITIES:   Nonedematous, with no obvious deformities.    SKIN:   Good color, with no significant rashes.  No pallor.    NEURO:   No obvious neurological deficits.  Patient moves without  difficulty.    MUSCULOSKELETAL:   Patient has  some generalized lower back tenderness  to  palpation.  No localized, midline bony tenderness and no evidence of  fracture.  Lower extremity motor function is normal. Lower extremity sensation is  intact.  DTRs are equal and normal bilaterally. Pain is most likely due to  soft-tissue  pain (muscular and/or ligamentous).    MEDICAL DECISION MAKING:  Pertinent Results.:  URINALYSIS:    25-Oct-2015 01:52, Urinalysis,Macroscopic    Urinalysis,Macroscopic  Results...     PATIENTJYLAN LOEZA                 MRN: [id number] LOC:  1EDE,1ECB,22   BILL#  : [id number]                     DOB: 10/20/1950 SEX: M AGE:  65   ORDERED BY: Corwin Levins  ORDERED  : 10/25/2015  01:29                             COLLECTED: 10/25/2015 01:52   ORDER     : [id number]                      RECEIVED : 10/25/2015  02:05   ---------------------------------------------------------------------  ---------   TEST NAME                  RESULT     UNITS    RANGES   ABN FL  ST   Appearance                 clear                           Clear  F   Color                      yellow                          Lt. Yell  F   Specific Gravity,Urine     1.005 1.005-1.         F   pH,Urine                   7.0                             5.0-8.0  F   Leukocytes                 NEG                             Negative  F   Nitrites                   NEG                             Negative  F   Total Protein,Urine        25                     mg/dL    Negative  F   Glucose,Urine              NORM                   mg/dL    Negative  F   Ketone,Urine               NEG                    mg/dL    Negative  F   Urobilinogen               NORM                   mg/dL    0-1  F   Bilirubin,Ur               NEG  Negative  F   Occult Blood,Ur            NEG                    {RBC}/uL Negative  F   ---------------------------------------------------------------------  --------    25-Oct-2015 01:52,  Urinalysis,Microscopic    Urinalysis,Microscopic  Results...     PATIENTVILIAMI BRACCO                 MRN: [id number] LOC:  1EDE,1ECB,22   BILL#  : [id number]                     DOB: [birth year] SEX: M AGE:  65   ORDERED BY: Corwin Levins              ORDERED  : 10/25/2015  01:29                             COLLECTED: 10/25/2015 01:52   ORDER     : [id number]                      RECEIVED : 10/25/2015  02:05   ---------------------------------------------------------------------  ---------   TEST NAME                  RESULT     UNITS    RANGES   ABN FL  ST   Volume,Urine               12 ml  F   WBC,Urine                  0-2                    /[HPF]   0-5  F   RBC,Urine                  Negative               /[HPF] 0-2  F   Epithelial Cells           Negative               /[HPF]   3-5  F   ---------------------------------------------------------------------  --------      SIGNIFICANT FINDINGS/ED COURSE/MEDICAL DECISION MAKING/TREATMENT  PLAN Patient  with a history of chronic low back pain presents with an acute episode  after  sleeping on a mat on the floor last night  Patient states he needs a note will tell the shelter that he needs to  sleep on  a bed.  Patient also admits to a history of hypertension but has been  noncompliant  recently.  Patient's discomfort has improved and he sleeping comfortably on the  stretcher.  His blood pressure is also improved with the 20 mg of lisinopril.    PROBLEM LIST:       Admit Reason:     lower back pain: Entered Date: 25-Oct-2015 00:59, Entered By:  Standley Brooking, Status: Active        DIAGNOSIS Acute episode of chronic low back pain  Hypertension, noncompliant    Electronic Signatures:  Bryon Lions, JOSEPH (DO)  (Signed 25-Oct-2015 03:19)   Authored: HISTORY OF PRESENT ILLNESS, PHYSICAL EXAM, MEDICAL DECISION  MAKING,  PROBLEM LIST, DIAGNOSIS      Last Updated: 25-Oct-2015 03:19 by Bryon Lions, JOSEPH (DO)  Please see T-Sheet, initial  assessment, and physician orders for  further details.    Dictating Physician: Corwin Levins, Montez Hageman, DO  Original Electronic Signature Date: 10/25/2015 01:32 A  JK  Document #: [id number]    cc:  PCP No       Soarian

## 2015-11-25 ENCOUNTER — Emergency Department: Admit: 2015-11-25 | Payer: MEDICAID

## 2015-11-25 ENCOUNTER — Inpatient Hospital Stay: Admission: EM | Admit: 2015-11-25 | Discharge: 2015-11-25 | Discharge status: Against Medical Advice | Payer: MEDICAID

## 2015-11-25 ENCOUNTER — Observation Stay: Payer: MEDICAID

## 2015-11-25 DIAGNOSIS — R55 Syncope and collapse: Principal | ICD-10-CM

## 2015-11-25 LAB — URINE DRUG SCREEN WITHOUT CONFIRMATION, STAT
Amphetamine, 500 ng/mL Cutoff: NEGATIVE
Barbiturates UR, 300  ng/mL Cutoff: NEGATIVE
Benzodiazepines UR, 300 ng/mL Cutoff: NEGATIVE
Buprenorphine, 5 ng/mL Cutoff: NEGATIVE
Cocaine UR, 300 ng/mL Cutoff: NEGATIVE
Fentanyl, 2 ng/mL Cutoff: NEGATIVE
Methadone, UR, 300 ng/mL Cutoff: NEGATIVE
Opiates UR, 300 ng/mL Cutoff: POSITIVE — AB
Oxycodone, 100 ng/mL Cutoff: NEGATIVE
THC UR, 50 ng/mL Cutoff: NEGATIVE
Tricyclic Antidepressants, 300 ng/mL Cutoff: NEGATIVE

## 2015-11-25 LAB — URINALYSIS-MACROSCOPIC W/REFLEX TO MICROSCOPIC
Bilirubin, UA: NEGATIVE
Glucose, UA: NEGATIVE mg/dL
Ketones, UA: NEGATIVE mg/dL
Leukocyte Esterase, UA: NEGATIVE
Nitrite, UA: NEGATIVE
Protein, UA: 30 mg/dL — AB
Specific Gravity, UA: 1.02 (ref 1.005–1.035)
Urobilinogen, UA: 0.2 EU/dL (ref 0.2–1.0)
pH, UA: 7 (ref 5.0–8.0)

## 2015-11-25 LAB — DIFFERENTIAL
Basophils Absolute: 97 /uL (ref 0–200)
Basophils Relative: 1 % (ref 0.0–1.0)
Eosinophils Absolute: 184 /uL (ref 15–500)
Eosinophils Relative: 1.9 % (ref 0.0–8.0)
Lymphocytes Absolute: 1950 /uL (ref 850–3900)
Lymphocytes Relative: 20.1 % (ref 15.0–45.0)
Monocytes Absolute: 941 /uL (ref 200–950)
Monocytes Relative: 9.7 % (ref 0.0–12.0)
Neutrophils Absolute: 6528 /uL (ref 1500–7800)
Neutrophils Relative: 67.3 % (ref 40.0–80.0)
nRBC: 0 /100{WBCs} (ref 0–0)

## 2015-11-25 LAB — HEPATIC FUNCTION PANEL
ALT: 13 U/L (ref 7–52)
AST: 20 U/L (ref 13–39)
Albumin: 3.4 g/dL (ref 3.5–5.7)
Alkaline Phosphatase: 57 U/L (ref 36–125)
Bilirubin, Direct: 0.1 mg/dL (ref 0.0–0.4)
Bilirubin, Indirect: 0.5 mg/dL (ref 0.0–1.1)
Total Bilirubin: 0.6 mg/dL (ref 0.0–1.5)
Total Protein: 5.5 g/dL (ref 6.4–8.9)

## 2015-11-25 LAB — VENOUS BLOOD GAS, LINE/SYRINGE
%HBO2-Line Draw: 32.3 % — ABNORMAL LOW (ref 40.0–70.0)
Base Excess-Line Draw: 4.8 mmol/L — ABNORMAL HIGH (ref <=3.0)
CO2 Content-Line Draw: 33 mmol/L — ABNORMAL HIGH (ref 25–29)
Carboxyhgb-Line Draw: 3.9 % — ABNORMAL HIGH (ref 0.0–2.0)
HCO3-Line Draw: 32 mmol/L — ABNORMAL HIGH (ref 24–28)
Methemoglobin-Line Draw: 0.4 % (ref 0.0–1.5)
PCO2-Line Draw: 58 mmHg — ABNORMAL HIGH (ref 41–51)
PH-Line Draw: 7.35 (ref 7.32–7.42)
PO2-Line Draw: 21 mmHg — ABNORMAL LOW (ref 25–40)
Reduced Hemoglobin-Line Draw: 63.4 % — ABNORMAL HIGH (ref 0.0–5.0)

## 2015-11-25 LAB — CBC
Hematocrit: 46.3 % (ref 38.5–50.0)
Hemoglobin: 15.9 g/dL (ref 13.2–17.1)
MCH: 30.6 pg (ref 27.0–33.0)
MCHC: 34.3 g/dL (ref 32.0–36.0)
MCV: 89.2 fL (ref 80.0–100.0)
MPV: 8.7 fL (ref 7.5–11.5)
Platelets: 209 10E3/uL (ref 140–400)
RBC: 5.19 10E6/uL (ref 4.20–5.80)
RDW: 15.5 % — ABNORMAL HIGH (ref 11.0–15.0)
WBC: 9.7 10E3/uL (ref 3.8–10.8)

## 2015-11-25 LAB — HIV 1+2 ANTIBODY/ANTIGEN WITH REFLEX: HIV 1+2 AB/AGN: NONREACTIVE

## 2015-11-25 LAB — HEMOGLOBIN A1C: Hemoglobin A1C: 5.8 % (ref 4.8–6.4)

## 2015-11-25 LAB — LIPID PANEL
Cholesterol, Total: 142 mg/dL (ref 0–200)
HDL: 33 mg/dL (ref 60–92)
LDL Cholesterol: 91 mg/dL
Triglycerides: 90 mg/dL (ref 10–149)

## 2015-11-25 LAB — BASIC METABOLIC PANEL
Anion Gap: 3 mmol/L (ref 3–16)
BUN: 13 mg/dL (ref 7–25)
CO2: 32 mmol/L (ref 21–33)
Calcium: 9.3 mg/dL (ref 8.6–10.3)
Chloride: 103 mmol/L (ref 98–110)
Creatinine: 0.73 mg/dL (ref 0.60–1.30)
Glucose: 93 mg/dL (ref 70–100)
Osmolality, Calculated: 286 mOsm/kg (ref 278–305)
Potassium: 3.7 mmol/L (ref 3.5–5.3)
Sodium: 138 mmol/L (ref 133–146)
eGFR AA CKD-EPI: >90 See note.
eGFR NONAA CKD-EPI: >90 See note.

## 2015-11-25 LAB — AMMONIA: Ammonia: 58 ug/dL (ref 27–90)

## 2015-11-25 LAB — ETHANOL, SERUM: Ethanol: <10 mg/dL (ref 0–10)

## 2015-11-25 LAB — URINALYSIS, MICROSCOPIC
RBC, UA: <1 /HPF (ref 0–3)
WBC, UA: 1 /HPF (ref 0–5)

## 2015-11-25 LAB — ACETAMINOPHEN LEVEL: Acetaminophen Level: <10 ug/mL (ref 10–30)

## 2015-11-25 LAB — SALICYLATE LEVEL: Salicylate Lvl: <3 mg/dL (ref 10–30)

## 2015-11-25 LAB — B NATRIURETIC PEPTIDE: BNP: 53 pg/mL (ref 0–100)

## 2015-11-25 LAB — CK: Total CK: 193 U/L (ref 30–223)

## 2015-11-25 LAB — TROPONIN I: Troponin I: <0.04 ng/mL (ref 0.00–0.03)

## 2015-11-25 MED ORDER — atorvastatin (LIPITOR) tablet 40 mg
40 | Freq: Every evening | ORAL | Status: AC
Start: 2015-11-25 — End: 2015-11-25

## 2015-11-25 MED ORDER — heparin (porcine) injection 5,000 Units
5000 | Freq: Three times a day (TID) | INTRAMUSCULAR | Status: AC
Start: 2015-11-25 — End: 2015-11-25

## 2015-11-25 MED ORDER — ondansetron (ZOFRAN) tablet 4 mg
4 | Freq: Three times a day (TID) | ORAL | Status: AC | PRN
Start: 2015-11-25 — End: 2015-11-25

## 2015-11-25 MED ORDER — acetaminophen (TYLENOL) tablet 650 mg
325 | ORAL | Status: AC | PRN
Start: 2015-11-25 — End: 2015-11-25

## 2015-11-25 MED ORDER — aspirin EC tablet 81 mg
81 | Freq: Every day | ORAL | Status: AC
Start: 2015-11-25 — End: 2015-11-25

## 2015-11-25 MED ORDER — ondansetron (ZOFRAN) 4 mg/2 mL injection 4 mg
4 | Freq: Three times a day (TID) | INTRAMUSCULAR | Status: AC | PRN
Start: 2015-11-25 — End: 2015-11-25

## 2015-11-25 MED ORDER — OMNIPAQUE (iohexol) 350 mg iodine/mL 100 mL
350 | Freq: Once | INTRAVENOUS | Status: AC | PRN
Start: 2015-11-25 — End: 2015-11-25

## 2015-11-25 MED ORDER — albuterol (PROVENTIL;VENTOLIN;PROAIR) inhaler 2 puff
90 | RESPIRATORY_TRACT | Status: AC | PRN
Start: 2015-11-25 — End: 2015-11-25

## 2015-11-25 MED ORDER — sodium chloride 0.9 % 1,000 mL IV fluid
Freq: Once | INTRAVENOUS | Status: AC
Start: 2015-11-25 — End: 2015-11-25
  Administered 2015-11-25: 11:00:00 1000 mL via INTRAVENOUS

## 2015-11-25 MED FILL — ATORVASTATIN 40 MG TABLET: 40 40 MG | ORAL | Qty: 1

## 2015-11-25 MED FILL — OMNIPAQUE 350 MG IODINE/ML INTRAVENOUS SOLUTION: 350 350 mg iodine/mL | INTRAVENOUS | Qty: 100

## 2015-11-25 NOTE — Unmapped (Signed)
[place] ED Note    Date of Service: 11/25/2015  Reason for Visit: Near Syncope      Patient History     HPI  Walter Duke is a 65 y.o. male the past medical history of COPD and multiple strokes with residual right-sided deficits who presents complaining of near-syncope.  This happened about one hour ago while the patient was standing, states that he felt his vision started to black out and he needed to sit down study himself.  He did not actually lose consciousness when this happened.  He states that this has never happened before, and he called 911 to be brought to the emergency department to be evaluated.  He now states that his legs and feet feel tired and weak and he has a burning in the center of his chest that feels like his reflux.  He states that he feel short of breath but this is no different than his normal shortness of breath.  He denies nausea, vomiting, abdominal pain, diaphoresis.  No hematuria, dysuria, headache, blurred vision, change in vision.    Past Medical History   Diagnosis Date   ??? COPD (chronic obstructive pulmonary disease)    ??? GERD (gastroesophageal reflux disease)    ??? Stroke      Past Surgical History   Procedure Laterality Date   ??? Hernia repair       Patient  reports that he has been smoking Cigarettes.  He has been smoking about 0.50 packs per day. He does not have any smokeless tobacco history on file. He reports that he does not drink alcohol. His drug history is not on file.  Previous Medications    No medications on file       Allergies:   Allergies as of 11/25/2015 - Fully Reviewed 11/25/2015   Allergen Reaction Noted   ??? Penicillins  11/25/2015       Review of Systems     ROS:  A full 10-point review of systems was conducted and is otherwise negative unless noted above in HPI.    Physical Exam     Filed Vitals:    11/25/15 0429 11/25/15 0624   BP: 162/108 143/87   Pulse: 80 82   Temp: 97.5 ??F (36.4 ??C) 97.6 ??F (36.4 ??C)    TempSrc: Oral Oral   Resp: 16 16   SpO2: 92% 92%     General:  well nourished; well developed; in no apparent distress   HEENT:  NCAT; PERRL; sclera anicteric, conjunctiva pink; MMM, no oropharyngeal erythema lesions   Neck:  no meningismus; trachea midline   Pulmonary:  Clear to auscultation bilaterally, no respiratory distress; no wheezes or rales   Cardiac:  regular rate and rhythm, no murmurs, rubs, or gallops   Abdomen:  Soft, non-tender, non-distended, no rebound or guarding   Musculoskeletal:  atraumatic exam with no focal swelling or tenderness  Vascular:  2+ peripheral pulses in bilateral upper and lower extremities, no peripheral edema   Skin:  warm and well perfused without rashes or lesions   Neuro: Alert and conversing appropriately. Moving all extremities with no gross deficits. 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 Possible additional aneurysm at the right carotid terminus. Recommend CT or MR angiogram of the head for further evaluation.      Report Verified by: Etheleen Nicks at 11/25/2015 6:57 AM EST      X-ray Chest PA and Lateral   Final Result   IMPRESSION:      Elevated left hemidiaphragm. Otherwise no acute cardiopulmonary disease.      I have personally reviewed the images and I agree with this report.      Report Verified by: Etheleen Nicks at 11/25/2015 5:44 AM EST        EKG:  Rate:75  Axis: Normal  PR: 170  QRS: Narrow  QTc: 422  ST Segments: No elevations or depressions  T waves: No inversions    Impression: Normal EKG, no signs of arrhythmia including Brugada, WPW, long QT syndrome, espilon waves    Emergency Department Procedures     ED Course and MDM     Walter Duke is a 65 y.o. male with a history and presentation as described above in HPI.  The patient was evaluated by myself and the ED R4 Dr. Darin Engels and ED Attending Physician, Dr. Nunzio Cory. All management and disposition plans were discussed and agreed upon.    Upon presentation, the patient was well-appearing, afebrile and hemodynamically stable. Patient presents with near-syncope as well as burning chest pain following this event.  He is given 1 L bolus of Normal saline to treat for possible orthostatic hypertension as a cause for his near syncope.  Laboratory workup significant for a normal CBC, and normal initial troponin, normal BNP, normal renal panel.  On reassessment, the patient stated he was very confused and not sure why he was here, therefore a broader laboratory workup was started to include a CT head and showed a VBG of 7.35 with a CO2 of 58 and a bicarbonate of 32 likely indicating a chronic compensated respiratory acidosis secondary to COPD. ammonia 58, acetaminophen negative, CK 193, ethanol less than 10 percent, LFTs show hypoalbuminemia and hypoproteinemia but no other abnormalities, negative salicylate level.  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  The patient has a nonfocal neurologic exam so it is unclear if this is contributing to his altered mental status or near syncope.  At this point we believe that it would be appropriate to admit the patient to medicine for further workup of his near syncope and altered mental status and for possible further imaging and evaluation of his posterior circulation aneurysms.  His UA and urine drug screen at this time are currently pending.    At this time the patient has been admitted to medicine for further evaluation and management of near syncope and altered mental status. The patient will continue to be monitored here  in the emergency department until which time he is moved to his new treatment location.    Impression     1. Near syncope    2. Confusion         Plan     1. The patient is to be admitted in stable/improved condition.  2. Workup, treatment and diagnosis were discussed with the patient and/or family members; the patient agrees to the plan and all questions were addressed and answered.    Jae Dire., MD, PGY-1  [facility]  Jae Dire, MD  Resident  11/25/15 [phone]

## 2015-11-25 NOTE — Unmapped (Signed)
Pt resting in bed with eyes open.  Breathing is even and non labored.  Bed is in lowest locked position and call light is within reach.  Pt denies any needs at this time.  Will continue to monitor.

## 2015-11-25 NOTE — Unmapped (Signed)
Department of Internal Medicine  History & Physical    Patient: Walter Duke  MRN: [id number]  CSN: [id number]    Chief Complaint     Dizziness and confusion    History of Present Illness     Walter Duke is a 65 y.o. male with a history of COPD and multiple strokes, presenting to the hospital with dizziness, confusion, and presyncope.    Patient reports that he is homeless.  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  Patient reports that he became dizzy all of a sudden and confused, where everything was spinning and he felt he would pass out.  Patient reports that he sat down then the dizziness stopped.  Patient did not lose consciousness.  He asked a security guard in the area to call 911 since he was not feeling well. Patient reports that he cannot remember if this has happened before or not.  Patient also complains of SOB, cough, and white sputum production which he reports has not changed or worsened and is his baseline.  Patient complains of acid reflux as well which causes him chest discomfort, however there have been no changes in his discomfort.  Patient denies fevers, chills, vision changes, headache, visual changes, N/V, abdominal pain, and dysuria.       Review of Systems     General: no fevers, chills, night sweats  HEENT: No blurry visions, headaches  GI: No abdominal pain, no nausea, vomiting, no diarrhea, no constipation  Respiratory: + shortness of breath, no wheezing  CV: no palpitations, no chest pain  MSK: + weakness, no edema  Neuro: no numbness, no tingling  Skin: No rashes, no lesions    Past Medical History     Past Medical History   Diagnosis Date   ??? COPD (chronic obstructive pulmonary disease)    ??? GERD (gastroesophageal reflux disease)    ??? Stroke        Past Surgical History     Past Surgical History   Procedure Laterality Date   ??? Hernia repair           Family History     No family history on file.      Social History      Social History     Social History   ??? Marital Status: Single     Spouse Name: N/A   ??? Number of Children: N/A   ??? Years of Education: N/A     Occupational History   ??? Not on file.     Social History Main Topics   ??? Smoking status: Current Every Day Smoker -- 0.50 packs/day     Types: Cigarettes   ??? Smokeless tobacco: Not on file   ??? Alcohol Use: No   ??? Drug Use: Not on file   ??? Sexual Activity: Not on file     Other Topics Concern   ??? Not on file     Social History Narrative   ??? No narrative on file         Medications     Home Meds:  Prior to Admission medications    Not on File          Inpatient Meds:  Scheduled:  ??? heparin (porcine)  5,000 Units Subcutaneous Q8H       Continuous:       [id number], ondansetron **OR** ondansetron      Vital Signs  Temp:  [97.5 ??F (36.4 ??C)-97.6 ??F (36.4 ??C)] 97.6 ??F (36.4 ??C)  Heart Rate:  [80-82] 82  Resp:  [16] 16  BP: (143-162)/(87-108) 143/87 mmHg  No intake or output data in the 24 hours ending 11/25/15 0857        Physical Exam     General: Alert, lying in hospital bed, cooperative, no acute distress  Head: Normocephalic, without obvious abnormality, atraumatic  Neck: Symmetrical, trachea midline  Lungs: Poor aeration throughout left lower lobe, expiratory wheezing, no respiratory distress  Heart: Regular rate and rhythm, normal S1/S2, no murmur, rub or gallop??  Abdomen: +BS, soft, mildly tender to palpation in LUQ, without rebound or guarding, no distention??  Extremities: Extremities normal, atraumatic, no cyanosis or edema, pusles 2+ b/l  Neuro: CN II-XII grossly intact, Strength in RUE 4/5, LUE 5/5, RLE 3/5, LLE 3/5,  A&Ox3, however not able to correctly remember the year ??  Skin: Skin warm and dry, no rashes or lesions noted??    Laboratory Data         Lab 11/25/15  0531   WBC 9.7   HEMOGLOBIN 15.9   HEMATOCRIT 46.3   MEAN CORPUSCULAR VOLUME 89.2   PLATELETS 209           Lab 11/25/15  0531   SODIUM 138   POTASSIUM 3.7   CHLORIDE 103   CO2 32   BUN 13    CREATININE 0.73   GLUCOSE 93           Lab 11/25/15  0531   CALCIUM 9.3                 Lab 11/25/15  0609   ALT 13   AST 20   ALK PHOS 57   BILIRUBIN TOTAL 0.6   BILIRUBIN DIRECT 0.1   ALBUMIN 3.4*           Lab 11/25/15  0736   COLOR, URINE Yellow   CLARITY Clear   SPECIFIC GRAVITY, URINE 1.020   PH UA 7.0   PROTEIN UA 30 mg/dL*   GLUCOSE UA Negative   KETONES UA Negative   BILIRUBIN UA Negative   BLOOD UA Trace-intact*   NITRITE UA Negative   UROBILINOGEN UA 0.2 E.U./dL   LEUKOCYTES UA Negative   RBC UA <1 /HPF   WBC UA 1   BACTERIA Rare*           Lab 11/25/15  0609 11/25/15  0531   CK TOTAL 193  --    TROPONIN I  --  <0.04       No results found for: NTPROBNP    No results found for: TSH, T3FREE, FREET4              Diagnostic Studies     CT Head WO IV contrast   Final Result   IMPRESSION:   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 Possible additional aneurysm at the right carotid terminus. Recommend CT or MR angiogram of the head for further evaluation.      Report Verified by: Walter Duke at 11/25/2015 6:57 AM EST      X-ray Chest PA and Lateral   Final Result   IMPRESSION:      Elevated left hemidiaphragm. Otherwise no acute cardiopulmonary disease.      I have personally reviewed the images and I agree with this report.      Report Verified by: Walter Duke  at 11/25/2015 5:44 AM EST          Assessment & Plan     Walter Duke is a 65 y.o. male being admitted to the hospital for Postural dizziness with presyncope. Medical problems being addressed in this encounter include the following:    Principal Problem:    Postural dizziness with presyncope    #Postural dizziness with Presyncope:  Most likely cause of patient's presyncopal episode is orthostatic hypotension vs vasovagal syncope vs drug intoxication vs hypoxemia. Patient received 1L bolus of fluids in the ED and his BPs have been 140s/80s.  Patient reports history of not eating since  breakfast 1 day ago.  In addition to positive opioids on urine tox screen make postural hypotension most likely.  Patient with history of COPD.  Patient also with history of multiple strokes however, he has no focal deficits in addition to negative CT findings.  EKG wnl.    Plan:   - Get Orthostatic BPs  - Will continue to monitor BPs  - Start maintenance fluids - NS @ 75 ml/hr  - Regular diet  - PT/OT    #COPD - Patient is not in exacerbation at this time.  Patient reports that his SOB, cough, and sputum production have not worsened.  Patient's VBG was significant for compensated respiratory acidosis likely secondary to his COPD. Patient's oxygen saturation has been 91-93%.     Plan:   - Start 1L oxygen for SpO2 < 88%, for goal 88-92%  - Duo-NEBs PRN for wheezing    #Acid reflux - Patient complains of chronic acid reflux, without any recent changes    Plan:  - Start Protonix    #DVT PPx  - subcutaneous Heparin      Nutrition:  Diet Orders          Diet regular starting at 03/07 0740          Code Status: Full Code    Please see Staff note for final plan.     Signed:  Adria Dill, MSIV  Medical Student  11/25/2015, 8:57 AM

## 2015-11-25 NOTE — Unmapped (Signed)
Pt resting in bed with eyes closed.  Breathing is even and non labored.  Bed is in lowest locked position and call light is within reach.  Pt denies any needs at this time.  Will continue to monitor.'

## 2015-11-25 NOTE — Unmapped (Signed)
ED Attending Attestation Note    Date of service:  11/25/2015    This patient was seen by the resident physician (Dr. Darin Engels).  I have seen and examined the patient, agree with the workup, evaluation, management and diagnosis. The care plan has been discussed and I concur.  I have reviewed the ECG and concur with the resident's interpretation.    My assessment reveals a 65 y.o. male Alert, answers questions but clear memory disorder, complains of confusion, likely delusions.  Clear lungs, benign abdomen.

## 2015-11-25 NOTE — Unmapped (Signed)
Patient ambulatory to restroom safely with cane.

## 2015-11-25 NOTE — Unmapped (Signed)
Patient presents to the ED stataing he feels like he is going to pass out. Patient denies chest pain and tightness.

## 2015-11-25 NOTE — Unmapped (Signed)
Patient pulled out IV.  band aid applied.  Patient signed AMA form and refused to wait for team to come to bedside.

## 2015-11-25 NOTE — Unmapped (Signed)
HOSPITAL MEDICINE ATTENDING   HISTORY & PHYSICAL    Name: Walter Duke  MRN: [id number]  CSN: [id number]    Chief complaint: dizziness and feeling faint    History of Present Illness     Walter Duke is an 65 y.o. male with a reported history of CVAs and COPD presents after almost passing out this morning at the bank. He came from [place] to [place] this morning in order to obtain a social security check from the bank down here when he felt woozy as he stepped off the bus. He then felt a sensation of lamp-shades closing before having to sit down. After sitting down he started to feel better. He denies ever losing consciousness. EMS was called and he was brought into the [facility] ED for further evaluation.    He says that he has not eaten anything since breakfast on 3/6 as he has not had the money to get food. He denies any drug use. He denies feeling otherwise sick, reporting that he has not had any fevers, chills, cough, urinary symptoms, nausea, vomiting, or diarrhea. He denies any sick contacts and he denies any other travel. He states that he's had stable SOB. He reports that he's had epigastric and chest burning that is chronic and he attributes it to GERD. He denies any worsening of this pain with exertion.    Of note, the patient's urine tox was positive for opioids.    Review of Systems     A 10-system review was performed and is otherwise negative.     Past Medical & Surgical History     Past Medical History   Diagnosis Date   ??? COPD (chronic obstructive pulmonary disease)    ??? GERD (gastroesophageal reflux disease)    ??? Stroke        Past Surgical History   Procedure Laterality Date   ??? Hernia repair           Family History     The patient does not report knowing any family history of illnesses.    Social History     Social History   Substance Use Topics   ??? Smoking status: 1/2 pack per day for 50 years     Types: Cigarettes   ??? Illicit drug use: The patient denies using drugs   ??? Alcohol Use: The patient  denies alcohol use     The patient is homeless.    Medications     No current facility-administered medications on file prior to encounter.     No current outpatient prescriptions on file prior to encounter.     Vital Signs     Temp:  [97.5 ??F (36.4 ??C)-97.6 ??F (36.4 ??C)] 97.6 ??F (36.4 ??C)  Heart Rate:  [80-82] 82  Resp:  [16] 16  BP: (143-162)/(87-108) 143/87 mmHg  No intake or output data in the 24 hours ending 11/25/15 1255      Physical Exam     Gen - alert, disheveled and malodorous, no distress  Eyes - normal conjunctiva, normal pupils  ENT - moist mucosa, no OP erythema or exudates  Neck - supple, no thyromegaly  Lymph - no adenopathy cervical or supraclavicular  CV - RRR, no MRG, no elevated JVP, no edema  Lung - CTA, normal WOB  Abd - +BS, soft nt/nd, no organomegaly  MSK - no clubbing, no cyanosis, no joint swelling, no muscle tenderness  Skin - generally erythematous skin  Neuro - alert, oriented to self/place/time/situation, no  facial assymmetry, no gross focal weakness  Psych - normal mood, normal behavior    Laboratory Data         Lab 11/25/15  0531   WBC 9.7   HEMOGLOBIN 15.9   HEMATOCRIT 46.3   MEAN CORPUSCULAR VOLUME 89.2   PLATELETS 209           Lab 11/25/15  0531   SODIUM 138   POTASSIUM 3.7   CHLORIDE 103   CO2 32   BUN 13   CREATININE 0.73   GLUCOSE 93           Lab 11/25/15  0531   CALCIUM 9.3                 Lab 11/25/15  0609   ALT 13   AST 20   ALK PHOS 57   BILIRUBIN TOTAL 0.6   BILIRUBIN DIRECT 0.1   ALBUMIN 3.4*           Lab 11/25/15  0736   COLOR, URINE Yellow   CLARITY Clear   SPECIFIC GRAVITY, URINE 1.020   PH UA 7.0   PROTEIN UA 30 mg/dL*   GLUCOSE UA Negative   KETONES UA Negative   BILIRUBIN UA Negative   BLOOD UA Trace-intact*   NITRITE UA Negative   UROBILINOGEN UA 0.2 E.U./dL   LEUKOCYTES UA Negative   RBC UA <1 /HPF   WBC UA 1   BACTERIA Rare*           Lab 11/25/15  0609 11/25/15  0531   CK TOTAL 193  --    TROPONIN I  --  <0.04       No results found for: NTPROBNP    No  results found for: TSH, T3FREE, FREET4              Diagnostic Studies     CT HEAD WO CONTRAST dated 11/25/2015 6:42 AM EST  ??  CLINICAL HISTORY: Syncope; ??  ??  TECHNIQUE: Standard protocol head CT  ??  COMPARISON: None  ??  FINDINGS:  No definite acute intracranial hemorrhage. No mass effect or midline shift. Mild diffuse volume loss with proportionate mild ventricular enlargement. Mild white matter chronic small vessel ischemic disease. Calvarium is normal.  ??  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 The right carotid terminus region also appears prominent which may reflect an additional aneurysm.  ??  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 Possible additional aneurysm at the right carotid terminus. Recommend CT or MR angiogram of the head for further evaluation.  ??  Report Verified by: Etheleen Nicks at 11/25/2015 6:57 AM EST      XR CHEST PA AND LATERAL dated 11/25/2015 5:34 AM EST  ??  CLINICAL HISTORY: Syncope and collapse; ??  ??  COMPARISON: None  ??  FINDINGS : There is elevation of the left hemidiaphragm. The lungs are clear there are no focal consolidations. There is no evidence of pneumothorax or pleural effusion. The descending aorta is moderately tortuous. Cardiomediastinal contours are otherwise within normal limits.  ??  IMPRESSION:  ??  Elevated left hemidiaphragm. Otherwise no acute cardiopulmonary disease.  ??  I have personally reviewed the images and I agree with this report.  ??  Report Verified by: Etheleen Nicks at 11/25/2015 5:44 AM EST      Assessment & Plan  Walter Duke is a 65 y.o. male being admitted to the hospital for vasovagal pre-syncope.      Principal Problem:    Postural dizziness with presyncope      # vasovagal pre-syncope  [name] presents to [place] after having an episode of vasovagal pre-syncope. The description of his symptom is classic,  noting that he felt woozy and that he had a sensation of lamp-shades closing before having to sit down and feeling better. He gives [place] an important historical factors that likely resulted in this presentation which is that he hasn't eaten in basically 24 hours because he hasn't had the money. Additionally, his urine tox turned positive for opioids, which is something he specifically denied. The combination of these two factors are what led to his feeling this way and almost fainting.     We will give him a diet and 1 days worth of fluids to make sure that was resuscitate him. We will also check orthostatics. His opioids will get metabolized out of his system in due time. We will also place him on telemetry (EKG on admission was completely normal).    We will have PT/OT evaluate him as well.    # cerebral aneurysm  CT of the patient's head revealed a brain aneurysm but no bleeding. This finding will need to be followed up after he discharges (most likely with serial images). We will obtain an angiogram of his brain to better characterize his vessels.    # h/o CVA  [name] reports a history of multiple strokes but he does not have any objective deficits. A head CT was repeated here which showed chronic microvascular disease, matching his history. He reports not taking any medicines at this time. He should at least be on an aspirin and statin, which we will start for him here for atherosclerosis.    We will also check an A1c and lipid panel for further risk stratification. EKG did not show any old infarcts. We've discussed smoking cessation with him.    # h/o COPD  He also reports a history of COPD but is not in exacerbation. He continues to smoke. He is not on any inhalers or controller medications.     We will order an albuterol inhaler in case he starts to have wheezing.    # GERD  We will put him on Protonix for treatment of GERD.    # VTE ppx  Subcutaneous heparin for VTE ppx.      The plan of care was  discussed with the patient, including the risks and benefits of all diagnostic tests and treatments. All of his questions were answered.      Walter Duke ADAM Luvenia Starch, MD  Attending Physician  Department of Internal Medicine  Pager ID [phone])  11/25/2015, 12:55 PM

## 2015-11-25 NOTE — Unmapped (Signed)
Patient dozing, color pink, respirations easy.  Arouses easily to verbal stimuli.  Denies pain at this time.  Preparing for admission to floor.

## 2015-11-25 NOTE — Unmapped (Signed)
Patient refused CT scan.  States that he is leaving. Patient will not elaborate as to why he refused, states he just doesn't want it. Team notified.

## 2015-12-29 ENCOUNTER — Emergency Department
Admit: 2015-12-29 | Disposition: A | Discharge status: Home / Self Care | Source: Home / Self Care | Attending: Emergency Medicine | Admitting: Emergency Medicine

## 2015-12-29 LAB — HX CBC W/ DIFF
CASE NUMBER: 2017100002935
HX HCT: 47 % — NL (ref 39.0–50.0)
HX HGB: 15.9 g/dL — NL (ref 13.0–17.0)
HX MCH: 30 pg — NL (ref 27.0–31.0)
HX MCHC: 33.8 g/dL — NL (ref 32.0–36.0)
HX MCV: 88 fL — NL (ref 80.0–94.0)
HX MPV: 10.5 fL — NL (ref 7.4–11.5)
HX NRBC PERCENT: 0 % — NL
HX PLATELET: 203 10*3/uL — NL (ref 150.0–400.0)
HX RBC: 5.32 10*6/uL — NL (ref 4.2–5.4)
HX RDW: 14.8 % — ABNORMAL HIGH (ref 11.5–14.5)
HX WBC: 8.1 10*3/uL — NL (ref 3.6–10.5)

## 2015-12-29 LAB — HX .AUTOMATED DIFF
CASE NUMBER: 2017100002935
HX ABSOLUTE NEUTRO COUNT: 4720 /mm3
HX BASOPHILS: 1 % — NL (ref 0.0–1.0)
HX EOSINOPHILS: 2 % — NL (ref 0.0–3.0)
HX IMMATURE GRANULOCYTES: 0 % — NL (ref 0.0–2.0)
HX LYMPHOCYTES: 27 % — NL (ref 22.0–40.0)
HX MONOCYTES: 11 % — NL (ref 0.0–11.0)
HX NEU CT MEASURED: 4.72
HX NEUTROPHILS: 58 % — NL (ref 40.0–71.0)

## 2015-12-29 LAB — HX BLUE TOP TO HOLD: CASE NUMBER: 2017100002935

## 2015-12-30 LAB — HX COMPREHENSIVE METABOLIC PANEL
CASE NUMBER: 2017100002935
HX ALBUMIN LVL: 3.4 g/dL — ABNORMAL LOW (ref 3.5–5.0)
HX ALKALINE PHOSPHATASE: 79 U/L — NL (ref 45.0–117.0)
HX ALT: 22 U/L — NL (ref 6.0–78.0)
HX ANION GAP: 5 — NL (ref 3.0–11.0)
HX AST: 22 U/L — NL (ref 6.0–40.0)
HX BILIRUBIN TOTAL: 0.3 mg/dL — NL (ref 0.2–1.0)
HX BUN: 14 mg/dL — NL (ref 7.0–18.0)
HX CALCIUM LVL: 8.8 mg/dL — NL (ref 8.5–10.1)
HX CHLORIDE: 106 mmol/L — NL (ref 98.0–107.0)
HX CO2: 32 mmol/L — NL (ref 21.0–32.0)
HX CREATININE: 0.906 mg/dL — NL (ref 0.55–1.3)
HX GLUCOSE LVL: 147 mg/dL — ABNORMAL HIGH (ref 65.0–110.0)
HX POTASSIUM LVL: 4 mmol/L — NL (ref 3.6–5.2)
HX SODIUM LVL: 143 mmol/L — NL (ref 136.0–145.0)
HX TOTAL PROTEIN: 6.2 g/dL — NL (ref 6.0–8.0)

## 2015-12-30 LAB — HX GLOMERULAR FILTRATION RATE (ESTIMATED)
CASE NUMBER: 2017100002935
HX AFN AMER GLOMERULAR FILTRATION RATE: >90
HX NON-AFN AMER GLOMERULAR FILTRATION RATE: 90 mL/min/{1.73_m2}

## 2015-12-30 LAB — HX TROPONIN I
CASE NUMBER: 2017100002935
HX TROPONIN I: 0.03 ng/mL — NL (ref 0.015–0.045)

## 2016-01-15 LAB — HX HEM-ROUTINE
HX BASO #: 0.1 10*3/uL (ref 0.0–0.2)
HX BASO: 1 %
HX EOSIN #: 0.2 10*3/uL (ref 0.0–0.5)
HX EOSIN: 3 %
HX HCT: 48.7 % — ABNORMAL HIGH (ref 37.0–47.0)
HX HGB: 16.8 g/dL — ABNORMAL HIGH (ref 13.5–16.0)
HX IMMATURE GRANULOCYTE#: 0.1 10*3/uL (ref 0.0–0.1)
HX IMMATURE GRANULOCYTE: 1 %
HX LYMPH #: 2.6 10*3/uL (ref 1.0–4.0)
HX LYMPH: 29 %
HX MCH: 30.7 pg (ref 26.0–34.0)
HX MCHC: 34.5 g/dL (ref 32.0–36.0)
HX MCV: 88.9 fL (ref 80.0–98.0)
HX MONO #: 1 10*3/uL — ABNORMAL HIGH (ref 0.2–0.8)
HX MONO: 11 %
HX MPV: 10.7 fL (ref 9.1–11.7)
HX NEUT #: 5.2 10*3/uL (ref 1.5–7.5)
HX NRBC #: 0 10*3/uL
HX NUCLEATED RBC: 0 %
HX PLT: 221 10*3/uL (ref 150–400)
HX RBC BLOOD COUNT: 5.48 M/uL (ref 4.20–5.50)
HX RDW: 15.3 % — ABNORMAL HIGH (ref 11.5–14.5)
HX SEG NEUT: 57 %
HX WBC: 9.1 10*3/uL (ref 4.0–11.0)

## 2016-01-15 LAB — HX COAGULATION
HX INR PT: 1 (ref 0.9–1.3)
HX PROTHROMBIN TIME: 11.6 s (ref 9.7–14.0)
HX PTT: 33.4 s (ref 25.7–35.7)

## 2016-01-15 LAB — HX CHEM-PANELS
HX ANION GAP: 12 (ref 5–18)
HX BLOOD UREA NITROGEN: 13 mg/dL (ref 6–24)
HX CHLORIDE (CL): 106 meq/L (ref 98–110)
HX CO2: 25 meq/L (ref 20–30)
HX CREATININE (CR): 0.88 mg/dL (ref 0.57–1.30)
HX GFR, AFRICAN AMERICAN: 105 mL/min/{1.73_m2}
HX GFR, NON-AFRICAN AMERICAN: 91 mL/min/{1.73_m2}
HX GLUCOSE: 95 mg/dL (ref 70–139)
HX POTASSIUM (K): 4.4 meq/L (ref 3.6–5.1)
HX SODIUM (NA): 143 meq/L (ref 135–145)

## 2016-01-15 LAB — HX CHEM-LFT
HX ALANINE AMINOTRANSFERASE (ALT/SGPT): 25 IU/L (ref 0–54)
HX ALKALINE PHOSPHATASE (ALK): 71 IU/L (ref 40–130)
HX ASPARTATE AMINOTRANFERASE (AST/SGOT): 32 IU/L (ref 10–42)
HX BILIRUBIN, DIRECT: 0.2 mg/dL (ref 0.0–0.3)
HX BILIRUBIN, TOTAL: 0.6 mg/dL (ref 0.2–1.1)

## 2016-01-15 LAB — HX TOXICOLOGY-DRUG, SERUM
HX ACETAMINOPHEN: <3 ug/mL — ABNORMAL LOW (ref 10–20)
HX BARBITUATES QL, SERUM: NOT DETECTED
HX BENZODIAZEPINES QL, SERUM: NOT DETECTED
HX ETHANOL: <10 mg/dL
HX SALICYLATE: <5 mg/dL — ABNORMAL LOW (ref 15.0–29.9)
HX TCA: NOT DETECTED

## 2016-01-15 LAB — HX CHEM-ENZ-FRAC: HX TROPONIN I: 0.02 ng/mL (ref 0.00–0.03)

## 2016-01-15 LAB — HX CHEM-OTHER: HX LIPASE: 44 IU/L (ref 8–60)

## 2016-01-15 LAB — HX DIABETES: HX GLUCOSE: 95 mg/dL (ref 70–139)

## 2016-01-16 ENCOUNTER — Ambulatory Visit: Admitting: Emergency Medicine

## 2016-01-16 LAB — HX CHEM-ENZ-FRAC: HX TROPONIN I: 0.02 ng/mL (ref 0.00–0.03)
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
## 2016-01-16 NOTE — ED Provider Notes (Signed)
.  .  Name: [name] [name] [name] [name]  MRN: [id number]  Age: 65 yrs  Sex: Male  DOB: July 01, 1951  Arrival Date: 01/15/2016  Arrival Time: 21:33  Account#: [id number]  Bed A10  PCP:  Chief Complaint: Epigastric Pain  .  Presentation:  04/27  21:34 Presenting complaint: EMS states: Pt. c/o epigastric pain that  kw24        started 45 min PTA. [place] from [facility] after a        bystander called 911.  21:34 Method Of Arrival: EMS: [facility]                              kw24  21:34 Acuity: Adult 3                                                 kw24  .  Historical:  - Allergies:  21:36 Penicillins;                                                    kw24  - Home Meds:  21:36 None [Active];                                                  kw24  - PMHx:  21:36 COPD; GERD; Hypertension;                                       kw24  - PSHx:  21:36 Abdominal Surgery;                                              kw24  .  - Social history: Smoking status: Patient states was never    smoker of tobacco. Patient/guardian denies using alcohol,    street drugs.  - Source of Home Medications: Patient.  - The history from nurses notes was reviewed: and I agree with    what is documented.  .  .  Screening:  21:34 SEPSIS SCREENING SIRS Criteria (> = 2) No. Safety screen:       kw24        Patient feels safe. Suicide Screening: Patients presentation:        No risk factors Patient denies thoughts of harm. Nutritional        screening: No deficits noted. Tuberculosis screening: No        symptoms or risk factors identified. Exposure Risk/Travel        Screening: None identified.  22:08 Fall Risk None identified.  kw24  .  Vital Signs:  21:34 BP 138 / 95; Pulse 93; Resp 17; Temp 97.0; Pulse Ox 93% on R/A; kw24  04/28  00:13 BP 128 / 92 Left Arm Sitting (auto/reg); Pulse 97 Monitor; Resp co        22 Spontaneous; Pulse Ox 90% on R/A; Pain  2/10;  00:13 Pulse Ox 96% on 2 lpm NC;                                       co  .  Name:[name] [name]  [id number]  [id number]  Page 1 of 5  %%PAGE  .  Name: [name] [name] [name] [name]  MRN: [id number]  Age: 81 yrs  Sex: Male  DOB: 28-Apr-1951  Arrival Date: 01/15/2016  Arrival Time: 21:33  Account#: [id number]  Bed A10  PCP:  Chief Complaint: Epigastric Pain  .  01:22 BP 126 / 80 Left Arm Supine (auto/reg); Pulse 88; Resp 16;      jm42        Pulse Ox 94% on R/A; Pain 0/10;  04:43 BP 126 / 78 Left Arm Supine (auto/reg); Pulse 75; Resp 16;      jm42        Pulse Ox 96% on 2 lpm NC;  06:24 BP 140 / 88; Pulse 76; Resp 16; Temp 97.9; Pulse Ox 97% ;       jh1  06:27 Pain 0/10;                                                      jm42  00:13 RN Valda Lamb notified of oxygen                                 co  .  Triage Assessment:  04/27  21:34 General: Appears in no apparent distress, unkempt, Behavior is  kw24        appropriate for age, cooperative, pleasant. Pain: Complains of        pain in chest. Neuro: Level of Consciousness is awake, alert,        Oriented to person, place, time, Grips are equal bilaterally        Moves all extremities. Speech is normal, Facial symmetry        appears normal. Respiratory: Airway is patent Respiratory        effort is even, unlabored, Respiratory pattern is regular,        symmetrical. GI: No deficits noted.  .  Assessment:  22:08 General: Appears in no apparent distress, obese, unkempt,       kw24        Behavior is appropriate for age, cooperative. Pain: Complains        of pain in Epigastric Pain currently is 8 out of 10 on a pain        scale. Quality of pain is described as burning, Pain began 1        hour ago Is continuous Goal of pain control is to be pain free.        Neuro: Level of Consciousness is awake, alert, Oriented to        person, place, time, Grips  are equal bilaterally Moves all        extremities. Gait is steady, Speech is normal, Facial symmetry        appears  normal, Left Pupil is PERRL. Right Pupil is PERRL.        Cardiovascular: Cardiovascular: Rhythm is sinus rhythm.        Respiratory: Airway is patent Respiratory effort is even,        unlabored, Respiratory pattern is regular, symmetrical, Reports        cough that is non-productive. Skin: Skin is intact, Skin is        dry, Skin is pink, warm / dry. normal, Skin temperature is warm.  .  Observations:  21:33 Patient arrived in ED.                                          kw24  21:34 Triage Completed.                                               kw24  21:39 Patient Visited By: Rometta Emery  22:03 Registration completed.                                         lc17  22:03 Patient Visited By: Noel Journey                            lc17  22:16 Patient Visited By: Lemont Fillers  .  Name:[name] [name] [name] [name]  [id number]  [id number]  Page 2 of 5  %%PAGE  .  Name: [name] [name] [name] [name]  MRN: [id number]  Age: 30 yrs  Sex: Male  DOB: 1951/09/12  Arrival Date: 01/15/2016  Arrival Time: 21:33  Account#: [id number]  Bed A10  PCP:  Chief Complaint: Epigastric Pain  .  04/28  00:03 Patient assigned to A10                                         lh11  .  Procedure:  04/27  21:58 EKG done. (by ED staff). No Old EKG Reviewed By: Clearance Coots        MD.  [phone] Labs drawn. (by ED staff). Sent per order to lab. Inserted      kw24        peripheral IV: 20 gauge in right antecubital area and blood        (including any ordered blood cultures) drawn.  04/28  00:12 O2 via nasal cannula @ 2L/min.  jm42  00:14 EKG done. Reviewed By: Renold Genta MD.                         co  03:25 EKG done. (by ED staff). Old EKG Obtained Reviewed By: Charlott Holler MD.  06:25 Discontinued.                                                   jh1  .  Dispensed Medications:  04/27  22:11 Drug: Zantac 150 mg Route: PO;                                   kw24  22:11 Drug: GI cocktail - (Maalox Suspension 30 mL, Lidocaine Viscous kw24        2 % 10 mL) Route: PO;  22:16 Drug: Duo-Neb - Albuterol/Atrovent - (Atrovent 0.5 mg,          kw24        Albuterol 2.5 mg) Route: Nebulizer;  23:11 Drug: Albuterol 2.5 mg Route: Nebulizer; Infused Over: 10 mins; jm42  .  Marland Kitchen  Interventions:  22:03 ECG/EKG Scanned into Chart                                      dw  22:04 Demo Sheet Scanned into Chart                                   mc31  22:07 Armband on Placed in gown Call light in reach Bed in low        kw24        position.  22:17 EMS Sheet Scanned into Chart                                    dw  23:09 Report given to Hazle Quant RN.                                       [id number]  04/28  00:18 ECG/EKG Scanned into Chart                                      ls18  03:25 ECG/EKG Scanned into Chart                                      ls18  .  Social Services:  04/27  22:38 SOCIAL WORK NOTE: PA referral for this 65 year old homeless     rg15        White male requesting transportation to [place], [place] which this  .  Name:[name] [name] [name] [name]  [id number]  [id number]  Page 3 of 5  %%PAGE  .  Name:  [name] [name] [name] [name]  MRN: [id number]  Age: 29 yrs  Sex: Male  DOB: 08-21-1951  Arrival Date: 01/15/2016  Arrival Time: 21:33  Account#: [id number]  Bed A10  PCP:  Chief Complaint: Epigastric Pain  .        LICSW was unable to provide. Pt. willing to discharge to the        [facility]; provided taxi voucher to [address].        Social work will follow up as needed. Jerene Dilling, LICSW        Pager [phone].  .  Outcome:  23:57 Decision to Hospitalize by Provider.                            AV40  04/28  06:08 Hospitalize undone.                                             ab39  06:09 Discharge ordered by MD.                                        [phone]  06:27 Discharged to a shelter: [address] - pt provided    [facility] [employer]. Condition: good. Pain: Denies pain.  Discharge        instructions given to patient, Instructed on discharge        instructions, follow up and referral plans. Demonstrated        understanding of instructions. Discharge Assessment: Patient        awake, alert and oriented x 3. No cognitive and/or functional        deficits noted. Patient verbalized understanding of disposition        instructions. Chart Status Nursing note complete and        electronically signed.  06:27 Patient left the ED.                                            jm42  .  Corrections: (The following items were deleted from the chart)  04/27  21:37 21:34 Acuity: Adult 2 kw24                                      kw24  21:39 21:34 Presenting complaint: EMS states: Pt. c/o epigastric pain kw24        that started 45 min PTA kw24  22:10 22:08 Cardiovascular: kw24                                      kw24  .  Signatures:  [place], [place]                                  dw  [place], [place]  RN   jh1  Reather Laurence                        CCT  co  [place], [address]  [place], [place]                         MD   ab39  Shellia Cleverly                         RN   jm42  Janene Harvey                        CCT  ib1  [place], [place]                         [place]   kp7  [place], [place]                         Quin Hoop                        RN   lh11  [place], [place]                      Sec  ls18  Dimple Casey                           RN   [address], [place]                             lc17  .  Name:[name] [name] [name] [name]  [id number]  [id number]  Page 4 of 5  %%PAGE  .  Name: [name] [name] [name] [name]  MRN: [id number]  Age: 65 yrs  Sex: Male  DOB: [birth year]  Arrival Date: 01/15/2016  Arrival Time: 21:33  Account#: [id number]  Bed A10  PCP:  Chief Complaint: Epigastric Pain  .  E[place] Lathe                          CCT   jl33  .  .  .  .  .  .  .  .  .  .  .  .  .  .  .  .  .  .  .  .  .  .  .  .  .  .  .  .  .  .  .  .  .  .  .  .  .  .  .  .  .  .  Name:[name] [name] [name] [name]  [id number]  [id number]  Page 5 of 5  .  %%END

## 2016-03-03 ENCOUNTER — Emergency Department: Admit: 2016-03-03 | Payer: MEDICARE

## 2016-03-03 ENCOUNTER — Inpatient Hospital Stay: Admit: 2016-03-03 | Discharge: 2016-03-03 | Discharge status: Against Medical Advice | Payer: MEDICARE

## 2016-03-03 DIAGNOSIS — R0789 Other chest pain: Secondary | ICD-10-CM

## 2016-03-03 DIAGNOSIS — Z5900 Homelessness unspecified: Secondary | ICD-10-CM

## 2016-03-03 LAB — HEPATIC FUNCTION PANEL
ALT: 17 U/L (ref 7–52)
AST: 22 U/L (ref 13–39)
Albumin: 3.7 g/dL (ref 3.5–5.7)
Alkaline Phosphatase: 67 U/L (ref 36–125)
Bilirubin, Direct: 0.06 mg/dL (ref 0.00–0.40)
Bilirubin, Indirect: 0.24 mg/dL (ref 0.00–1.10)
Total Bilirubin: 0.3 mg/dL (ref 0.0–1.5)
Total Protein: 6.6 g/dL (ref 6.4–8.9)

## 2016-03-03 LAB — BASIC METABOLIC PANEL
Anion Gap: 6 mmol/L (ref 3–16)
BUN: 12 mg/dL (ref 7–25)
CO2: 31 mmol/L (ref 21–33)
Calcium: 9.7 mg/dL (ref 8.6–10.3)
Chloride: 102 mmol/L (ref 98–110)
Creatinine: 0.81 mg/dL (ref 0.60–1.30)
Glucose: 88 mg/dL (ref 70–100)
Osmolality, Calculated: 287 mOsm/kg (ref 278–305)
Potassium: 4 mmol/L (ref 3.5–5.3)
Sodium: 139 mmol/L (ref 133–146)
eGFR AA CKD-EPI: >90 See note.
eGFR NONAA CKD-EPI: >90 See note.

## 2016-03-03 LAB — URINALYSIS-MACROSCOPIC W/REFLEX TO MICROSCOPIC
Bilirubin, UA: NEGATIVE
Blood, UA: NEGATIVE
Glucose, UA: NEGATIVE mg/dL
Ketones, UA: NEGATIVE mg/dL
Leukocytes, UA: NEGATIVE
Nitrite, UA: NEGATIVE
Protein, UA: 100 mg/dL
Specific Gravity, UA: 1.015 (ref 1.005–1.035)
Urobilinogen, UA: 0.2 EU/dL (ref 0.2–1.0)
pH, UA: 7 (ref 5.0–8.0)

## 2016-03-03 LAB — URINE DRUG SCREEN WITHOUT CONFIRMATION, STAT
Amphetamine, 500 ng/mL Cutoff: NEGATIVE
Barbiturates UR, 300  ng/mL Cutoff: NEGATIVE
Benzodiazepines UR, 300 ng/mL Cutoff: NEGATIVE
Buprenorphine, 5 ng/mL Cutoff: NEGATIVE
Cocaine UR, 300 ng/mL Cutoff: NEGATIVE
Fentanyl, 2 ng/mL Cutoff: NEGATIVE
Methadone, UR, 300 ng/mL Cutoff: NEGATIVE
Opiates UR, 300 ng/mL Cutoff: NEGATIVE
Oxycodone, 100 ng/mL Cutoff: NEGATIVE
THC UR, 50 ng/mL Cutoff: NEGATIVE
Tricyclic Antidepressants, 300 ng/mL Cutoff: NEGATIVE

## 2016-03-03 LAB — TROPONIN I
Troponin I: <0.04 ng/mL (ref 0.00–0.03)
Troponin I: <0.04 ng/mL (ref 0.00–0.03)
Troponin I: <0.04 ng/mL (ref 0.00–0.03)

## 2016-03-03 LAB — DIFFERENTIAL
Basophils Absolute: 130 /uL (ref 0–200)
Basophils Relative: 1.3 % (ref 0.0–1.0)
Eosinophils Absolute: 270 /uL (ref 15–500)
Eosinophils Relative: 2.7 % (ref 0.0–8.0)
Lymphocytes Absolute: 1930 /uL (ref 850–3900)
Lymphocytes Relative: 19.3 % (ref 15.0–45.0)
Monocytes Absolute: 900 /uL (ref 200–950)
Monocytes Relative: 9 % (ref 0.0–12.0)
Neutrophils Absolute: 6770 /uL (ref 1500–7800)
Neutrophils Relative: 67.7 % (ref 40.0–80.0)
nRBC: 0 /100 WBC (ref 0–0)

## 2016-03-03 LAB — URINALYSIS, MICROSCOPIC: WBC, UA: <1 /HPF (ref 0–5)

## 2016-03-03 LAB — CBC
Hematocrit: 47.1 % (ref 38.5–50.0)
Hemoglobin: 15.9 g/dL (ref 13.2–17.1)
MCH: 29.7 pg (ref 27.0–33.0)
MCHC: 33.8 g/dL (ref 32.0–36.0)
MCV: 87.6 fL (ref 80.0–100.0)
MPV: 8.2 fL (ref 7.5–11.5)
Platelets: 223 10*3/uL (ref 140–400)
RBC: 5.37 10*6/uL (ref 4.20–5.80)
RDW: 16.2 % (ref 11.0–15.0)
WBC: 10 10*3/uL (ref 3.8–10.8)

## 2016-03-03 LAB — LIPASE: Lipase: 108 U/L (ref 4–82)

## 2016-03-03 NOTE — Unmapped (Signed)
ED Attending Attestation Note    Date of service:  03/03/2016    This patient was seen by the resident physician.  I have seen and examined the patient, agree with the workup, evaluation, management and diagnosis. The care plan has been discussed and I concur.  I have reviewed the ECG and concur with the resident's interpretation.    My assessment reveals a 65 y.o. male with complex social history here for chest pressure when walking. Pain 3/10. ECG non-ischemic. Tick removed from back.

## 2016-03-03 NOTE — Unmapped (Signed)
Updated ED Note    Reason for Visit:  Patient Active Problem List   Diagnosis   ??? Postural dizziness with presyncope   03/03/2016     Updated ED Course  Briefly, Walter Duke is a 65 y.o. male with history of COPD, active smoker, HTN, HLD who presented with reported chest pressure. The patient was evaluated by myself and the ED Attending Physician, Dr. Alyse Low. All management and disposition plans were discussed and agreed upon.    The patient's repeat laboratory studies were within normal limits. UDS negative. Troponin negavtive x3. EKG was within normal limits. Social work spoke with and evaluated the patient and determined he had no further needs at this time. Stress test was ordered, but the patient refused evaluation.     The patient has decided to leave against medical advice, because of desire to not . I personally discussed that following with them:    That they currently had a medical condition of: chest pain and I am concerned that he could potentially be having acute coronary or other serious pathology despite his normal laboratory work..     My proposed course of evaluation and treatment and that of any consultants was: nuclear stress test to monitor for acute abnormality.    Risks of leaving before this had been completed include: misdiagnosis, worsening illness leading up to and including chronic pain, prolonged or permanent disability or death. Specific risks pertinent, but not all inclusive, of their current medical condition include but are not limited to: heart attack, stroke or even death.    The benefits of additional diagnostics, therapies, and potential admission have also been explained, including the availability and proximity of nurses, physicians, monitoring, diagnostic testing, and treatment. Specifically explained were the benefits to the patient of diagnosing / excluding ACS, which if identified early would lead to appropriate intervention in a timely manner lessing the burden  of disability and death described above.    I also discussed alternatives to the plan including: allowing him to eat and getting stress test at a later date. Also just continuing to monitor his cardiac enzymes.     Despite all of the above, they stated they wanted to leave and refused further evaluation, treatment, or admission at this time.     They appear clinically sober, to be mentating appropriately, free from distracting injury, have controlled pain, appear to have intact insight, judgement, and reason and in my opinion have the capacity to make this decision. Specifically, they were able to verbally state back in a coherent manner their current medical condition/current diagnosis, the proposes course of treatment, and the risks, benefits, and alternatives of treatment versus leaving against medical advice.     They understand that they may return to seek medical attention here at whatever time they want. I highly advised them to return to the Emergency Department immediately if they experienced any: chest pain, difficulty breathing, syncope, lightheadedness, reconsidered treatment a/o admission, or had any other concerns. This would be without any repercussions. At this time, the patient was given opportunity to ask questions about their medical condition, and they denied questions at this time.       Assessment    Atypical Chest Pain    Plan    Discharged Against Medical Advice    Jarman Litton Griselda Miner, MD  PGY-2 Internal Medicine       Lynnea Maizes, MD  Resident  03/03/16 [phone]

## 2016-03-03 NOTE — Unmapped (Signed)
PSW went to speak with pt, but he was sleeping soundly an unable to be roused. PSW will continue to follow pt.    Morley Kos, LISW - S  Psychiatric Social Worker  [facility]   [phone]

## 2016-03-03 NOTE — Unmapped (Signed)
Pt refused stress test. Went to dept and refused testing per report. Pt brought back to ED and is refusing treatment. This SN informed and instructed on the benefits of staying. MD updated and discussing POC with pt. Pt stating I just want something to eat

## 2016-03-03 NOTE — Unmapped (Signed)
Pt to ED with c/o dizziness and abd pain since yesterday. Pt reports It feels like my heart burn is acting up. Pt appeared to stumble at triage desk and was put into wheelchair. States he has been losing balance since last night. Hx of stroke with R sided weakness. Denies N/V.

## 2016-03-03 NOTE — Unmapped (Signed)
Patient received in stress lab and checked in. Test explained to the patient. Patient refuses to continue with the test. Patient agitated and began walking in the hall when attempting to return the patient to the Ed. Security called to escort patient and CT to ED.

## 2016-03-03 NOTE — Unmapped (Signed)
[facility] ED Note    Reason for Visit: Dizziness and Abdominal Pain    03/03/2016    Patient History     HPI:  This is a 65 y.o. male with history of COPD, active smoker, HTN, ?HLD presenting for chest pain.    Patient reports chest pain.  He says it feels like his ex-wife is sitting on his chest.  He states it feels like pressure.  Patient states it is there when walking, but also there after eating.  Patient currently rates his pain as 3 out of 10.  Patient denies vomiting, or radiation of the pain.  It is in the center of his chest.  He denies fevers, prior cardiac history.    Patient is a very difficult historian.  He also reports feeling lightheaded like he needs to pass out.  When asked about his admission here at [facility] for similar symptoms he states I don't remember that at all.  Patient reports he just walked from [facility] here to [place].  He states he did this in 4 days.       Past Medical History   Diagnosis Date   ??? COPD (chronic obstructive pulmonary disease)    ??? GERD (gastroesophageal reflux disease)    ??? Stroke        Past Surgical History   Procedure Laterality Date   ??? Hernia repair         Fae Pippin  reports that he has been smoking Cigarettes.  He has been smoking about 0.50 packs per day. He does not have any smokeless tobacco history on file. He reports that he does not drink alcohol. His drug history is not on file.    Patient's Medications    No medications on file       Allergies:   Allergies as of 03/03/2016 - Fully Reviewed 03/03/2016   Allergen Reaction Noted   ??? Penicillins  11/25/2015       Review of Systems     ROS:  Remainder of 10 point ROS negative as reported by the patient, except where noted in HPI.       Physical Exam     ED Triage Vitals   Vital Signs Group      Temp 03/03/16 0618 97.9 ??F (36.6 ??C)      Temp Source 03/03/16 0618 Oral      Heart Rate 03/03/16 0618 84      Heart Rate Source 03/03/16 0618 Monitor       Resp 03/03/16 0618 18      SpO2 03/03/16 0618 93 %      BP 03/03/16 0618 152/101 mmHg      BP Location 03/03/16 0618 Left arm      BP Method 03/03/16 0618 Automatic      Patient Position 03/03/16 0618 Sitting   SpO2 03/03/16 0618 93 %   O2 Device 03/03/16 0618 None (Room air)       General:  This is a Disheveled male in no acute distress who appears their stated age.    HEENT:  NC/AT.  PERRL.  OP clear.  MMM.    Neck:  Supple. Trachea midline. No JVD appreciated.    Pulmonary:   CTAB without adventitious sounds.    Cardiac:  RRR -m/g/r.    Abdomen:  Normoactive BS. Soft, nt/nd. No masses or organomegaly.    Musculoskeletal:  WWP with no clubbing, cyanosis, or deformities noted.    Vascular:  +2 radial  pulses.    Skin: Tic noted to the right posterior flank    Neuro:  AAO to person, place.  Symmetric face, EOMI, and no gross CN deficits noted.  Strength 5/5 and symmetric throughout the upper and lower extremities. Sens intact to light touch bilaterally and symmetric. Cerebellar exam demonstrates normal RAM, finger-nose-finger. Normal gait      Diagnostic Studies     Labs:    Please see electronic medical record for any tests performed in the ED     Radiology:    Please see electronic medical record for any tests performed in the ED    EKG:    Indication chest pain, Rate 84, Rhythm sinus, Interval normal, Axis normal, ST Segment Change none and Comparison to prior EKG none available  Impression: normal sinus rhythm without acute ischemia or arrythmia    Emergency Department Procedures         ED Course and MDM     EGE MUCKEY is a 65 y.o. male who presented to the emergency department with chest pain and feeling lightheaded.  The patient was evaluated by myself and the ED attending physician.  All care plans were discussed and agreed upon.    Though disheveled, and obviously homeless, the patient appears generally well and has stable vital signs and an unremarkable physical exam.    A tick was noted to the  right flank which was successfully removed.  We have confirmed via visualization the tick is a dog tick.  As such not suitable doctor for Lyme disease.    His EKG is normal.  At this time he is pending further laboratory workup for chest pain.  He presented close prior to shift change so I will be signing over the patient to my colleague.  My anticipation is that patient will need laboratory analysis for this chest pressure which she describes as somewhat concerning.  Pending normal troponin, he is likely candidate for the observation unit chest pain protocol.  Very low suspicion for PE given symptomatology, normal vital signs, lack of hypoxia or tachycardia.    Fortunately patient presents a very difficult social circumstances and is providing very contradictory history.    My colleague's responsibilities will be to follow up laboratory studies and determine final disposition.      Impression     Chest pain    Plan     1) The patient is transitioned to the oncoming provider.       Anne Fu, MD MPH   [facility]  Emergency Medicine PGY-4      Janace Aris, MD  Resident  03/03/16 [phone]

## 2016-03-03 NOTE — Unmapped (Signed)
PSW was made aware that pt is in the ED and will require PSW services. Chart review in progress.    Roshan Salamon S. Juanell Saffo, LISW - S  Psychiatric Social Worker  [facility]   [phone]

## 2016-03-03 NOTE — Unmapped (Signed)
Psych Social Work Brief Note      Presentation: Pt is a 65 year old white male who appears older than his stated age, who came to [facility] for chest pain. He reports a history of COPD and stroke. Pt reports that he walked to [place] from [place], [place] and was light headed and dizzy and came her to the hospital. Pt was last at [facility] on 11-25-15, but he does not remember this.    Current Mental Status: Pt currently denies SI / HI, denies any A / V hallucinations. If he has walked all the way from [facility], one would expect that he would be dirty, smell of the elements and/or body odor, and would be more disheveled than this gentleman appears. Pt does not open his right eye, but otherwise maintains good eye contact. He does not volunteer much information about himself and became irritated with PSW asking him a lot of questions. When asked how long it took him to walk from [place] to [place], he stated, I don't know; I didn't keep track. He stated that he would like to get back on his Social Security so he can have money to get a place to stay. PSW spoke with pt about the [facility] homeless [facility],  [place] he stated that he doesn't want to have to follow any program. PSW advised pt that they could help him establish and address here and help him get back on social security. Pt stated that he will think about it.    Substance Use:  Pt reports no use or abuse of substances. Urine drug test was negative for substances.    History of Mental Health Treatment: Pt denies any history of mental illness.    Social History: Pt would not provide any social history. Would not state where he has been staying, where he's from originally or what he is doing in [place].    Collateral: Pt would not provide any collateral contact information. He stated that he doesn't have any family. When asked specifically if he has children, he stated that he has kids,  but they have nothing to do with me.    Telepsychiatry Considerations:Pt currently does not require any psychiatric emergency services.    Formulation of Plan: Pt did not fit any criteria to warrant being placed on a Statement of Belief at this time. PSW provided pt with community resources for counseling and psychiatry as well as the suicide hot line. Pt was also provided with a map and contact information for PES should symptoms worsen. Pt is expected to discharge to home when medically cleared.    Patient Reaction to Plan: Accepting of plan.    Handoff of Communication: None needed.    Transportation Plan: PSW will provide pt with a cab ride to the [facility] or to the [employer] downtown if this is what he wishes at time of discharge.    Morley Kos, LISW - S  Psychiatric Social Worker  [facility]   [phone]

## 2016-03-03 NOTE — ED Provider Notes (Signed)
[facility] Emergency Department  03/03/16     Patient Identification  Guido Comp is a 65 y.o. male    Chief Complaint  Back Pain (chronic back pain worse today Pt states he has been walking alot )      Mode of Arrival  EMS    HPI  (History provided by patient)  This is a 65 y.o. male with a PMH significant for  has a past medical history of Cerebral artery occlusion with cerebral infarction (HCC); COPD (chronic obstructive pulmonary disease) (HCC); GERD (gastroesophageal reflux disease); and Hypertension. presented today for chronic back pain.  Patient states he has chronic back pain.  He states he's been "walking a lot".  He states he basically is walked here from [place].  States he was born in [place].  He states he tried to get his way back to [place].  He is homeless.  He basically states she's been walking on his legs are tired and his back hurts.  He walked to the fire station and asked him to bring him into the hospital tonight.  He has no other complaints.  His main issue is that he is homeless.  The back pain is not new.    ROS  10 systems reviewed and negative except as in HPI    I have reviewed the following nursing documentation:  Allergies: No Known Allergies    Past medical history:  has a past medical history of Cerebral artery occlusion with cerebral infarction (HCC); COPD (chronic obstructive pulmonary disease) (HCC); GERD (gastroesophageal reflux disease); and Hypertension.    Past surgical history:  has no past surgical history on file.    Home medications:   Prior to Admission medications    Not on File       Social history:  reports that he has been smoking Cigarettes.  He does not have any smokeless tobacco history on file. He reports that he does not drink alcohol or use illicit drugs.    Family history:  History reviewed. No pertinent family history.    Exam  ED Triage Vitals   BP Temp Temp Source Pulse Resp SpO2 Height Weight   03/03/16 2058 03/03/16 2058 03/03/16 2058 03/03/16 2058  03/03/16 2058 03/03/16 2058 03/03/16 2058 03/03/16 2058   133/89 98.2 ??F (36.8 ??C) Oral 99 16 99 %  (1.676 m) 210 lb (95.3 kg)        Nursing notes and vital signs reviewed    Constitutional - patient oriented to person, place, time.  Well-developed well-nourished.  Nontoxic.  In no acute distress.    HEENT  Head- normocephalic, atraumatic  Eyes-anicteric sclera, PERRL, no discharge  Ears-external canals normal  Throat-clear of exudate, no erythema  Mouth-membranes mucosa moist and pink    Lymphatic-  No significant lymphadenopathy noted in cervical, submandibular, auricular or inguinal regions    Neck-supple, trachea midline.  No meningismus.    Cardiovascular-regular rate and rhythm, no gallops rubs or murmurs, 2+ radial pulses    Pulmonary/Chest-  effort normal.  No respiratory distress.  Clear to auscultation bilaterally, no stridor, no wheezes, no rales    Abdomen- soft nondistended.  No tenderness.  No rebound or guarding.  Bowel sounds normal.  No masses.    Musculoskeletal- no extremity edema.  Compartments soft.  No deformity.  No tenderness in the extremities.  Back-no tenderness over the bony spine.    Neurologic- alert and oriented to person place and time.  No facial droop.  No  slurred speech.  Motor intact in all 4 extremities.  Normal muscle tone.  Gait normal.    Skin- warm and dry, no rash.  No petechiae.    Psychiatric- normal mood and affect.          MDM/ED Course    No orders of the defined types were placed in this encounter.        Radiology  No results found.        Labs    No results found for this visit on 03/03/16.    . We thoroughly discussed the history, physical exam, laboratory and imaging studies, as well as, emergency department course. Based upon that discussion, we've decided to discharge [facility] home.  Patient will be discharged to homeless shelter and provided Transportation there     Final Impression    1. Homeless single person        Blood pressure 133/89, pulse  99, temperature 98.2 ??F (36.8 ??C), temperature source Oral, resp. rate 16, height 5\' 6"  (1.676 m), weight 95.3 kg (210 lb), SpO2 99 %.    Disposition:  Discharge to home in stable condition.              This chart was generated in part by using Dragon Dictation system and may contain errors related to that system including errors in grammar, punctuation, and spelling, as well as words and phrases that may be inappropriate. If there are any questions or concerns please feel free to contact the dictating provider for clarification.          Erroll Lunaobert B Kayelee Herbig, MD  03/04/16 [phone]

## 2016-03-03 NOTE — ED Notes (Signed)
Pt verbalized understanding of discharge instructions, follow-up care. Skin warm, pink, dry. Respirations unlabored. GCS 15. No s/s of distress noted prior to ambulating out of dept with steady gait. Taxi called for transport to homeless shelter.     Rito Ehrlichmanda Breckon Reeves, RN  03/03/16 2135

## 2016-03-04 ENCOUNTER — Inpatient Hospital Stay
Admit: 2016-03-04 | Discharge: 2016-03-04 | Disposition: A | Discharge status: Home / Self Care | Attending: Emergency Medicine

## 2016-03-16 DIAGNOSIS — I214 Non-ST elevation (NSTEMI) myocardial infarction: Principal | ICD-10-CM

## 2016-03-16 MED ORDER — folic acid (FOLVITE) tablet 1 mg
1 | Freq: Once | ORAL | Status: AC
Start: 2016-03-16 — End: 2016-03-16
  Administered 2016-03-17: 02:00:00 1 mg via ORAL

## 2016-03-16 MED ORDER — aspirin chewable tablet 324 mg
81 | Freq: Once | ORAL | Status: AC
Start: 2016-03-16 — End: 2016-03-16
  Administered 2016-03-17: 02:00:00 324 mg via ORAL

## 2016-03-16 MED ORDER — thiamine (VITAMIN B-1) tablet 100 mg
100 | Freq: Once | ORAL | Status: AC
Start: 2016-03-16 — End: 2016-03-16
  Administered 2016-03-17: 02:00:00 100 mg via ORAL

## 2016-03-16 MED ORDER — nitroGLYCERIN (NITROSTAT) SL tablet 0.4 mg
0.4 | SUBLINGUAL | Status: AC | PRN
Start: 2016-03-16 — End: 2016-03-18
  Administered 2016-03-17 (×3): 0.4 mg via SUBLINGUAL

## 2016-03-16 MED ORDER — heparin (porcine) injection 4,000 Units
5000 | Freq: Once | INTRAMUSCULAR | Status: AC
Start: 2016-03-16 — End: 2016-03-16
  Administered 2016-03-17: 03:00:00 4000 [IU] via INTRAVENOUS

## 2016-03-16 MED ORDER — morphine injection 4 mg
4 | Freq: Once | INTRAMUSCULAR | Status: AC
Start: 2016-03-16 — End: 2016-03-16
  Administered 2016-03-17: 04:00:00 4 mg via INTRAVENOUS

## 2016-03-16 MED ORDER — mometasone-formoterol (DULERA HFA) 200-5 mcg/actuation inhaler 2 puff
200-5 | Freq: Two times a day (BID) | RESPIRATORY_TRACT | Status: AC
Start: 2016-03-16 — End: 2016-03-17
  Administered 2016-03-17 (×2): 2 via RESPIRATORY_TRACT

## 2016-03-16 MED ORDER — multivitamin with folic acid tablet 1 tablet
400 | Freq: Once | ORAL | Status: AC
Start: 2016-03-16 — End: 2016-03-16
  Administered 2016-03-17: 02:00:00 1 via ORAL

## 2016-03-16 MED ORDER — lactated Ringers 1,000 mL IV fluid
Freq: Once | INTRAVENOUS | Status: AC
Start: 2016-03-16 — End: 2016-03-17
  Administered 2016-03-17: 04:00:00 1000 mL via INTRAVENOUS

## 2016-03-16 MED ORDER — heparin 25000 units in 0.45% NaCl 250 mL IV infusion
25000 | INTRAVENOUS | Status: AC
Start: 2016-03-16 — End: 2016-03-17
  Administered 2016-03-17: 03:00:00 12 [IU]/kg/h via INTRAVENOUS

## 2016-03-16 MED ORDER — albuterol (PROVENTIL) nebulizer solution 2.5 mg
2.5 | Freq: Once | RESPIRATORY_TRACT | Status: AC
Start: 2016-03-16 — End: 2016-03-16
  Administered 2016-03-17: 02:00:00 2.5 mg via RESPIRATORY_TRACT

## 2016-03-16 MED ORDER — ipratropium-albuterol (DUO-NEB) 0.5 mg-3 mg(2.5 mg base)/3 mL nebulizer solution 3 mL
0.5 | Freq: Once | RESPIRATORY_TRACT | Status: AC
Start: 2016-03-16 — End: 2016-03-16
  Administered 2016-03-17: 02:00:00 3 mL via RESPIRATORY_TRACT

## 2016-03-16 MED ORDER — heparin (porcine) injection 4,000 Units
5000 | Freq: Four times a day (QID) | INTRAMUSCULAR | Status: AC | PRN
Start: 2016-03-16 — End: 2016-03-17

## 2016-03-16 MED ORDER — heparin (porcine) injection 2,500 Units
5000 | Freq: Four times a day (QID) | INTRAMUSCULAR | Status: AC | PRN
Start: 2016-03-16 — End: 2016-03-17

## 2016-03-16 MED FILL — THERA 400 MCG TABLET: 400 400 mcg | ORAL | Qty: 1

## 2016-03-16 MED FILL — IPRATROPIUM 0.5 MG-ALBUTEROL 3 MG (2.5 MG BASE)/3 ML NEBULIZATION SOLN: 0.5 0.5 mg-3 mg(2.5 mg base)/3 mL | RESPIRATORY_TRACT | Qty: 3

## 2016-03-16 MED FILL — THIAMINE HCL (VITAMIN B1) 100 MG TABLET: 100 100 MG | ORAL | Qty: 1

## 2016-03-16 MED FILL — HEPARIN (PORCINE) 5,000 UNIT/ML INJECTION SOLUTION: 5000 5,000 unit/mL | INTRAMUSCULAR | Qty: 1

## 2016-03-16 MED FILL — HEPARIN (PORCINE) 25,000 UNIT/250 ML IN 0.45 % SODIUM CHLORIDE IV SOLN: 25000 25,000 unit/250 mL | INTRAVENOUS | Qty: 250

## 2016-03-16 MED FILL — FOLIC ACID 1 MG TABLET: 1 1 MG | ORAL | Qty: 1

## 2016-03-16 MED FILL — ALBUTEROL SULFATE 2.5 MG/3 ML (0.083 %) SOLUTION FOR NEBULIZATION: 2.5 2.5 mg /3 mL (0.083 %) | RESPIRATORY_TRACT | Qty: 3

## 2016-03-16 MED FILL — NITROSTAT 0.4 MG SUBLINGUAL TABLET: 0.4 0.4 mg | SUBLINGUAL | Qty: 25

## 2016-03-16 MED FILL — ASPIRIN 81 MG CHEWABLE TABLET: 81 81 MG | ORAL | Qty: 4

## 2016-03-16 MED FILL — MORPHINE 4 MG/ML INJECTION SYRINGE: 4 4 mg/mL | INTRAMUSCULAR | Qty: 1

## 2016-03-16 MED FILL — DULERA 200 MCG-5 MCG/ACTUATION HFA AEROSOL INHALER: 200-5 200-5 mcg/actuation | RESPIRATORY_TRACT | Qty: 8.8

## 2016-03-16 NOTE — Unmapped (Signed)
Pt c/o increased chest pain and SOB. MD Crews aware. Repeat EKG done. Aspirin given.

## 2016-03-16 NOTE — Unmapped (Signed)
Pt given three nitro tablets. Still c/o chest pain. MD Winders aware. Heparin gtt started.

## 2016-03-16 NOTE — Unmapped (Addendum)
Pt ambulatory to ED with complaints of fatigue and dizziness x 2 days. Pt homeless and states he walks a lot. Also complaints of back pain and bilateral foot pain. Denies chest pain, SOB, or other symptoms.

## 2016-03-16 NOTE — Unmapped (Signed)
ED Attending Attestation Note    Date of service:  03/16/2016    This patient was seen by the resident physician.  I have seen and examined the patient, agree with the workup, evaluation, management and diagnosis. The care plan has been discussed and I concur.  I have reviewed the ECG and concur with the resident's interpretation.    My assessment reveals a 65 y.o. male history of hypertension, COPD, and chronic back pain, but he denies a history of diabetes or hypercholesterolemia, but who has had a prior left hemispheric stroke with residual right-sided weakness and memory difficulties, who presents the emergency department complaining of shortness breath like my COPD is acting up.  The patient also says he is having gastroesophageal reflux disease pain which he says he feels as a pain in his chest like my ex-wife is sitting on me.  He says he is short of breath in association with this.  He is not nauseated and is not becomes sweaty.  There is not a clear exertional component.  Of note, I saw the patient a week ago and he had similar complaints.  At that time he was placed in the clinical decision unit for assessment of his chest pain is possible ACS, but he left AGAINST MEDICAL ADVICE.    On examination.  Very poor air movement.  There were however no wheezes, rales, or rhonchi.  On cardiac exam he had a distant S1 and S2 heard no murmurs gallops or rubs.  There is no jugular venous distention.  He had the same vague memory problems that he had the last time I saw him.  He had very mild right-sided weakness.  He was somewhat unsteady on his feet.  His abdomen was benign.    The patient's electrocardiogram showed some lateral precordial ST-T wave changes there were new since last visit, and he complained of an increase in pain during the course of my interview and a repeated EKG at that time showed that the lateral precordial ST-T wave changes were more pronounced.

## 2016-03-16 NOTE — Unmapped (Signed)
APTT and INR added on. Phlebotomy notified.

## 2016-03-16 NOTE — Unmapped (Signed)
MD Crews at bedside.

## 2016-03-16 NOTE — Unmapped (Signed)
Pt to ED stating he's been dizzy for a couple days and passed out a couple days ago. Pt c/o pain in feet, legs, and back having states his reflux is acting up. Pt is homeless and walks a lot. Pt states he is out of his blood pressure, cholesterol, and reflex medication. Pt states he falls a lot due to pain in his back and weakness in legs.

## 2016-03-16 NOTE — Unmapped (Signed)
[facility] ED Note    Date of Service: 03/16/2016    Reason for Visit: Dizziness and Back Pain      Patient History     HPI:  This is a 65 y.o. male with PMH noted below presenting for back pain and LE pain. Pt is an unrelaible historian and states that he recently walked here from up [place] and has pain in his legs and back from walking. He repeats himself frequently on interview.    Chart review show history of stroke with residual right sided weakness.     While discussing the pain in his legs, pt also complaining of my reflux acting up, which he describes as a mid sternal burning pain. Denies radiations, SOB or n/v.     Past Medical History   Diagnosis Date   ??? COPD (chronic obstructive pulmonary disease)    ??? GERD (gastroesophageal reflux disease)    ??? Stroke        Past Surgical History   Procedure Laterality Date   ??? Hernia repair         Fae Pippin  reports that he has been smoking Cigarettes.  He has been smoking about 0.50 packs per day. He does not have any smokeless tobacco history on file. He reports that he does not drink alcohol. His drug history is not on file.    Patient's Medications    No medications on file       Allergies:   Allergies as of 03/16/2016 - Fully Reviewed 03/16/2016   Allergen Reaction Noted   ??? Penicillins  11/25/2015           Review of Systems     ROS:   A complete ROS was performed. All systems were reviewed and are negative except as noted in the history of the present illness, including  no HA, vision changes, CP, SOB, ABD pain, n/v/c/d.     Physical Exam     ED Triage Vitals   Vital Signs Group      Temp 03/16/16 2058 97.7 ??F (36.5 ??C)      Temp Source 03/16/16 2058 Oral      Heart Rate 03/16/16 2058 95      Heart Rate Source 03/16/16 2058 Monitor      Resp 03/16/16 2058 18      SpO2 03/16/16 2058 94 %      BP 03/16/16 2058 137/92 mmHg      BP Location --       BP Method --       Patient Position --    SpO2 03/16/16  2058 94 %   O2 Device 03/16/16 2058 None (Room air)       General: Non-toxic appearing, appears older than stated age, NAD  HEENT:  NCAT, PERRL, EOMI  Neck:  Supple, full ROM without tenderness  Pulmonary:  CTAB no WRR  Cardiac:  RRR, No MRG  Abdomen:  Soft; non-tender, non-distended; no rebound or guarding  Musculoskeletal:  Atraumatic exam with no focal swelling or tenderness, no peripheral edema   Vascular:  2+ radial pulses in bilateral upper extremities   Skin:  WWP, no rashes appreciated  Neuro:  AAOx4, face synetrical, unsteady gate repeatedly stumble to the right. Beats gravtity in all 4 and denies numbness.  Psych:  appropriate mood and affect         Diagnostic Studies     Labs:  Please see Epic for full details    Radiology:  X-ray Portable Chest   Final Result   IMPRESSION:    Increased bibasilar parenchymal opacities, which may be related to atelectasis and/or pneumonia, to include aspiration.       Increased right paratracheal opacity which may be related to superimposition of vasculature or underlying adenopathy.      Report Verified by: Blenda Peals, M.D. at 03/17/2016 9:36 AM EDT          EKG:  Rhythm: NSR  Rate: 89  Axis: 37  Ectopy: None  Conduction: no IVCD  ST Segments: subtle STD in II, aVF  T Waves:  TWI in lateral V4-6  Q Waves: none    Clinical Impression:Comparison is made to prior EKG from 03/03/2016.  Compared to the prior EKG there are T-wave inversions in the lateral  Emergency Department Procedures       ED Course and MDM     ORMAND SENN is a 65 y.o. male with a history and presentation as described above in HPI.  The patient was evaluated by myself, the senior resident and the ED Attending Physician. All management and disposition plans were discussed and agreed upon.    64yoM who is not well-appearing presenting with an unclear CC of leg pain from walking but also endorsing reflux which was concerning for chest pain equivalent.     While pt has documented stroke, review of his  mental status in the EMR is concerning for worsening from his reported baseline including an abnormal gait, which is not previously noted in the EMR. He has no LKN and would not be considered for treatment if acute stroke. Given his poor self care and likely chronic EtOH, I was also concerned for metabolic causes including Wernicke's. Labs for AMS sent and pt given oral rally pack. EKG obtained along with trop for suspected CP given report of reflux. EKG is subtly abnormal and initial trop negative. Given ASA.     During his stay in the emergency department, patient's chest pain acutely worsened and he was noted to be mildly diaphoretic and grabbing his chest.  At this time an immediate repeat EKG was obtained which shows deepening progression of his lateral T-wave inversions with mild ST depressions in similar distribution as well as ST depressions that are now pronounced him to, aVF.    His dynamic EKG changes are concerning for unstable angina. Trended troponins pending. Pt started on heparin drip. Pt given trial of nitro SL with no improvement in pain. Given morphine with moderate improvement but continuing to have CP. Given that the STD appear most prominent in the inferior leads, nitro was abandoned and pt given 1L IVF. Pt given additional morphine for CP. The acute flair of CP improved ofter several minutes and he appeared less flushed and sweaty. A thrid EKG was slightly improved from the second but continued to show dynamic change from the intial. Pt now with highly concerning EKG changes without enzymes, techically UA. Cardiology consulted for admission and possible intervention, and will admit pt for likely cath in the AM.     ED treatments given are listed below:  Medications - No data to display      Disposition:     At this time it was determined this patient warranted admission to the hospital. The admitting team was contacted, and they informed us to this patient's stay here in the emergency  department, and his workup here. The patient was informed as to our recommendations for admission, and agrees to them. Please  see the admitting team's dictations and notes for further treatment and course for this patient's hospital stay.     Impression     1. Unstable Angina      Christene Slates, MD, PGY-1   [facility]              Christene Slates, MD  Resident  03/18/16 [phone]

## 2016-03-17 ENCOUNTER — Inpatient Hospital Stay: Admit: 2016-03-17 | Payer: MEDICARE

## 2016-03-17 ENCOUNTER — Inpatient Hospital Stay
Admission: EM | Admit: 2016-03-17 | Discharge: 2016-03-18 | Disposition: A | Discharge status: Home / Self Care | Payer: MEDICARE | Admitting: Cardiovascular Disease

## 2016-03-17 LAB — TROPONIN I
Troponin I: <0.04 ng/mL (ref 0.00–0.03)
Troponin I: <0.04 ng/mL (ref 0.00–0.03)
Troponin I: <0.04 ng/mL (ref 0.00–0.03)

## 2016-03-17 LAB — DIFFERENTIAL
Basophils Absolute: 146 /uL (ref 0–200)
Basophils Relative: 1.1 % (ref 0.0–1.0)
Eosinophils Absolute: 200 /uL (ref 15–500)
Eosinophils Relative: 1.5 % (ref 0.0–8.0)
Lymphocytes Absolute: 2647 /uL (ref 850–3900)
Lymphocytes Relative: 19.9 % (ref 15.0–45.0)
Monocytes Absolute: 1064 /uL (ref 200–950)
Monocytes Relative: 8 % (ref 0.0–12.0)
Neutrophils Absolute: 9244 /uL (ref 1500–7800)
Neutrophils Relative: 69.5 % (ref 40.0–80.0)
nRBC: 0 /100 WBC (ref 0–0)

## 2016-03-17 LAB — CBC
Hematocrit: 49.8 % (ref 38.5–50.0)
Hemoglobin: 16.6 g/dL (ref 13.2–17.1)
MCH: 29.3 pg (ref 27.0–33.0)
MCHC: 33.4 g/dL (ref 32.0–36.0)
MCV: 87.8 fL (ref 80.0–100.0)
MPV: 8.7 fL (ref 7.5–11.5)
Platelets: 228 10E3/uL (ref 140–400)
RBC: 5.67 10E6/uL (ref 4.20–5.80)
RDW: 16.1 % — ABNORMAL HIGH (ref 11.0–15.0)
WBC: 13.3 10E3/uL — ABNORMAL HIGH (ref 3.8–10.8)

## 2016-03-17 LAB — AMMONIA: Ammonia: 91 ug/dL (ref 27–90)

## 2016-03-17 LAB — URINE DRUG SCREEN WITHOUT CONFIRMATION, STAT
Amphetamine, 500 ng/mL Cutoff: NEGATIVE
Barbiturates UR, 300  ng/mL Cutoff: NEGATIVE
Benzodiazepines UR, 300 ng/mL Cutoff: NEGATIVE
Buprenorphine, 5 ng/mL Cutoff: NEGATIVE
Cocaine UR, 300 ng/mL Cutoff: NEGATIVE
Fentanyl, 2 ng/mL Cutoff: NEGATIVE
Methadone, UR, 300 ng/mL Cutoff: NEGATIVE
Opiates UR, 300 ng/mL Cutoff: POSITIVE
Oxycodone, 100 ng/mL Cutoff: NEGATIVE
THC UR, 50 ng/mL Cutoff: NEGATIVE
Tricyclic Antidepressants, 300 ng/mL Cutoff: NEGATIVE

## 2016-03-17 LAB — VENOUS BLOOD GAS, LINE/SYRINGE
%HBO2-Line Draw: 43.4 % (ref 40.0–70.0)
Base Excess-Line Draw: 4.3 mmol/L (ref <=3.0)
CO2 Content-Line Draw: 32 mmol/L (ref 25–29)
Carboxyhgb-Line Draw: 2.7 % (ref 0.0–2.0)
HCO3-Line Draw: 30 mmol/L (ref 24–28)
Methemoglobin-Line Draw: 0.3 % (ref 0.0–1.5)
PCO2-Line Draw: 50 mm Hg (ref 41–51)
PH-Line Draw: 7.39 (ref 7.32–7.42)
PO2-Line Draw: 25 mm Hg (ref 25–40)
Reduced Hemoglobin-Line Draw: 53.6 % (ref 0.0–5.0)

## 2016-03-17 LAB — BASIC METABOLIC PANEL
Anion Gap: 9 mmol/L (ref 3–16)
BUN: 16 mg/dL (ref 7–25)
CO2: 29 mmol/L (ref 21–33)
Calcium: 10 mg/dL (ref 8.6–10.3)
Chloride: 102 mmol/L (ref 98–110)
Creatinine: 0.88 mg/dL (ref 0.60–1.30)
Glucose: 113 mg/dL — ABNORMAL HIGH (ref 70–100)
Osmolality, Calculated: 292 mosm/kg (ref 278–305)
Potassium: 3.7 mmol/L (ref 3.5–5.3)
Sodium: 140 mmol/L (ref 133–146)
eGFR AA CKD-EPI: >90 See note.
eGFR NONAA CKD-EPI: >90 See note.

## 2016-03-17 LAB — URINALYSIS W/RFL MICROSCOP, RFL CULTURE
Bilirubin, UA: NEGATIVE
Blood, UA: NEGATIVE
Glucose, UA: NEGATIVE mg/dL
Hyaline Casts, UA: 1 /LPF (ref 0–2)
Ketones, UA: NEGATIVE mg/dL
Nitrite, UA: NEGATIVE
Protein, UA: 30 mg/dL
RBC, UA: 1 /HPF (ref 0–3)
Specific Gravity, UA: 1.014 (ref 1.005–1.035)
Squam Epithel, UA: <1 /HPF (ref 0–5)
Urobilinogen, UA: <2 mg/dL (ref 0.2–1.9)
WBC, UA: 10 /HPF (ref 0–5)
pH, UA: 6 (ref 5.0–8.0)

## 2016-03-17 LAB — HEPATIC FUNCTION PANEL
ALT: 14 U/L (ref 7–52)
AST: 25 U/L (ref 13–39)
Albumin: 4.1 g/dL (ref 3.5–5.7)
Alkaline Phosphatase: 69 U/L (ref 36–125)
Bilirubin, Direct: 0 mg/dL (ref 0.0–0.4)
Bilirubin, Indirect: 0.5 mg/dL (ref 0.0–1.1)
Total Bilirubin: 0.5 mg/dL (ref 0.0–1.5)
Total Protein: 7.1 g/dL (ref 6.4–8.9)

## 2016-03-17 LAB — B NATRIURETIC PEPTIDE: BNP: 58 pg/mL (ref 0–100)

## 2016-03-17 LAB — URINE CULTURE: Culture Result: <1000

## 2016-03-17 LAB — T4, FREE: Free T4: 0.92 ng/dL (ref 0.61–1.76)

## 2016-03-17 LAB — APTT: aPTT: 32 seconds (ref 25.5–35.0)

## 2016-03-17 LAB — HEMOGLOBIN A1C: Hemoglobin A1C: 5.9 % (ref 4.8–6.4)

## 2016-03-17 LAB — PROTIME-INR
INR: 1 (ref 0.9–1.1)
Protime: 13.6 seconds (ref 11.6–14.4)

## 2016-03-17 LAB — TSH: TSH: 0.73 u[IU]/mL (ref 0.34–5.60)

## 2016-03-17 LAB — TREPONEMA PALLIDUM AB WITH REFLEX: Treponema Pallidum: NEGATIVE

## 2016-03-17 LAB — HIV 1+2 ANTIBODY/ANTIGEN WITH REFLEX: HIV 1+2 AB/AGN: NONREACTIVE

## 2016-03-17 LAB — VITAMIN B12: Vitamin B-12: 437 pg/mL (ref 180–914)

## 2016-03-17 MED ORDER — azithromycin (ZITHROMAX) tablet 500 mg
250 | Freq: Every day | ORAL | Status: AC
Start: 2016-03-17 — End: 2016-03-17
  Administered 2016-03-17: 14:00:00 500 mg via ORAL

## 2016-03-17 MED ORDER — aspirin EC tablet 81 mg
81 | Freq: Every day | ORAL | Status: AC
Start: 2016-03-17 — End: 2016-03-18
  Administered 2016-03-17: 14:00:00 81 mg via ORAL

## 2016-03-17 MED ORDER — lactated Ringers 500 mL IV fluid
Freq: Once | INTRAVENOUS | Status: AC
Start: 2016-03-17 — End: 2016-03-18

## 2016-03-17 MED ORDER — umeclidinium (INCRUSE ELLIPTA) 62.5 mcg/actuation DsDv
62.5 | Freq: Every day | RESPIRATORY_TRACT | 0 refills | Status: AC
Start: 2016-03-17 — End: ?
  Filled 2016-03-17: qty 30, 30d supply, fill #0

## 2016-03-17 MED ORDER — albuterol (PROVENTIL;VENTOLIN;PROAIR) 90 mcg/actuation inhaler
90 | RESPIRATORY_TRACT | 0 refills | Status: AC | PRN
Start: 2016-03-17 — End: ?
  Filled 2016-03-17: qty 18, 25d supply, fill #0

## 2016-03-17 MED ORDER — azithromycin (ZITHROMAX) tablet 250 mg
250 | Freq: Every day | ORAL | Status: AC
Start: 2016-03-17 — End: 2016-03-18

## 2016-03-17 MED ORDER — lactulose (CEPHULAC) oral soln Soln 20 g
20 | Freq: Three times a day (TID) | ORAL | Status: AC
Start: 2016-03-17 — End: 2016-03-18

## 2016-03-17 MED ORDER — heparin (porcine) injection 5,000 Units
5000 | Freq: Three times a day (TID) | INTRAMUSCULAR | Status: AC
Start: 2016-03-17 — End: 2016-03-18

## 2016-03-17 MED ORDER — budesonide-formoterol (SYMBICORT) 160-4.5 mcg/actuation inhaler
160-4.5 | Freq: Two times a day (BID) | RESPIRATORY_TRACT | 0 refills | 30.00000 days | Status: AC
Start: 2016-03-17 — End: ?
  Filled 2016-03-17: qty 10.2, 30d supply, fill #0

## 2016-03-17 MED ORDER — ipratropium-albuterol (DUO-NEB) 0.5 mg-3 mg(2.5 mg base)/3 mL nebulizer solution 3 mL
0.5 | RESPIRATORY_TRACT | Status: AC | PRN
Start: 2016-03-17 — End: 2016-03-18

## 2016-03-17 MED ORDER — ipratropium-albuterol (DUO-NEB) 0.5 mg-3 mg(2.5 mg base)/3 mL nebulizer solution 3 mL
0.5 | Freq: Four times a day (QID) | RESPIRATORY_TRACT | Status: AC
Start: 2016-03-17 — End: 2016-03-17
  Administered 2016-03-17: 12:00:00 3 mL via RESPIRATORY_TRACT

## 2016-03-17 MED ORDER — atorvastatin (LIPITOR) tablet 80 mg
80 | Freq: Every evening | ORAL | Status: AC
Start: 2016-03-17 — End: 2016-03-18
  Administered 2016-03-17: 07:00:00 80 mg via ORAL

## 2016-03-17 MED ORDER — predniSONE (DELTASONE) tablet 40 mg
20 | Freq: Every day | ORAL | Status: AC
Start: 2016-03-17 — End: 2016-03-18

## 2016-03-17 MED ORDER — pantoprazole (PROTONIX) EC tablet 40 mg
40 | Freq: Every day | ORAL | Status: AC
Start: 2016-03-17 — End: 2016-03-18
  Administered 2016-03-17: 14:00:00 40 mg via ORAL

## 2016-03-17 MED ORDER — sodium chloride 0.9 % flush 10 mL
INTRAMUSCULAR | Status: AC
Start: 2016-03-17 — End: 2016-03-18
  Administered 2016-03-17: 13:00:00 10 mL via INTRAVENOUS

## 2016-03-17 MED FILL — IPRATROPIUM 0.5 MG-ALBUTEROL 3 MG (2.5 MG BASE)/3 ML NEBULIZATION SOLN: 0.5 0.5 mg-3 mg(2.5 mg base)/3 mL | RESPIRATORY_TRACT | Qty: 3

## 2016-03-17 MED FILL — PANTOPRAZOLE 40 MG TABLET,DELAYED RELEASE: 40 40 MG | ORAL | Qty: 1

## 2016-03-17 MED FILL — ASPIRIN 81 MG TABLET,DELAYED RELEASE: 81 81 MG | ORAL | Qty: 1

## 2016-03-17 MED FILL — PREDNISONE 20 MG TABLET: 20 20 MG | ORAL | Qty: 2

## 2016-03-17 MED FILL — ATORVASTATIN 80 MG TABLET: 80 80 MG | ORAL | Qty: 1

## 2016-03-17 MED FILL — AZITHROMYCIN 250 MG TABLET: 250 250 MG | ORAL | Qty: 2

## 2016-03-17 MED FILL — HEPARIN (PORCINE) 5,000 UNIT/ML INJECTION SOLUTION: 5000 5,000 unit/mL | INTRAMUSCULAR | Qty: 1

## 2016-03-17 NOTE — Unmapped (Signed)
Report given to [facility] on 6NW. Pt taken to floor by this RN. All belongings sent with pt.
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
## 2016-03-17 NOTE — Unmapped (Signed)
Problem: Inadequate Gas Exchange  Goal: Patient is adequately oxygenated and ventilation is improved  Assess and monitor vital signs, oxygen saturation, respiratory status to include rate, depth, effort, and lung sounds, mental status, cyanosis, and labs (ABG???s). Monitor effects of medications that may sedate the patient. Collaborate with respiratory therapy to administer medications and treatments.  Outcome: Progressing  Aerosolized medications given to treat airway disease and improve gas exchange.  Oxygen initiated on patient and titrated to improve gas exchange.
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
## 2016-03-17 NOTE — Unmapped (Signed)
Pt refused inhaler education.  Inhaler papers left at bedside with pt.  Lyndle Herrlich RRT
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
## 2016-03-17 NOTE — Unmapped (Signed)
Patient refusing all meds at this time, saying he wants to leave. Team paged, will continue to monitor.

## 2016-03-17 NOTE — Unmapped (Signed)
Admitting team at bedside.

## 2016-03-17 NOTE — Unmapped (Signed)
Patient refusing vitals, fluids, and lab draws this morning. Patient on medical hold. Team aware. Will continue to monitor.

## 2016-03-17 NOTE — Unmapped (Signed)
Problem: Daily Care  Goal: Daily care needs are met  Assess and monitor ability to perform self care and identify potential discharge needs.  Outcome: Not Progressing  Patient refusing care, labs meds, vitals saying he wants to go home. Nurse educated patient on care but he refused teaching. Team aware. Will continue to monitor.

## 2016-03-17 NOTE — Unmapped (Signed)
Pt refusing vital signs, medication, and CMU monitoring, team aware. Pt agreeable to assessment, see doc flowsheets. Pt requesting to speak with chaplin, see consult order.

## 2016-03-17 NOTE — Unmapped (Addendum)
Clinical Pharmacy Services   Transitions in Care for COPD via I-CARE (Improving COPD Acute Decompensation Readmissions)     Walter Duke is a 65 y.o. White or Caucasian male admitted with COPD exacerbation.     Pharmacy was consulted for assistance with management of COPD medications on hospital discharge.    Immunizations:      There is no immunization history on file for this patient.    Plan:      Discharge regimen   The patient will be discharged on the following COPD inhaled medications:      Inhaled corticosteroid/long-acting beta agonist:   Budesonide-formoterol 160-4.5 mcg (Symbicort) 2 inhalations twice daily     Long-acting anti-muscarinic:   Umeclidinium 62.5 mcg (Incruse Ellipta) 1 inhalation once daily     Short-acting beta agonist hand held inhaler:   Albuterol MDI (Proair/Ventolin) 2 inhalations every 4 hours as needed for wheezing/SOB    Copay/third party information   Coordinated by [facility]     Education  Order placed for RT to provide bedside patient education.    Thanks and please call with any questions,  Angelica Pou, PharmD, BCPS  Pager: [phone]  On-Call Pager: [phone]  03/17/2016 4:31 PM

## 2016-03-17 NOTE — Unmapped (Signed)
Third EKG done on pt and troponin and BNP sent to lab. Pt resting in bed. Denies needs.

## 2016-03-17 NOTE — Unmapped (Signed)
Walter Duke is a 65 y.o. male admitted on 03/16/2016 for NSTEMI; an assessment for capacity to make decisions has been deemed necessary.    Pt orientation: self, location; not oriented to month, day, or year    The below criteria are adapted from the  which can be found at http://capm.wikispaces.com/file/view/ACE.pdf    Capacity Assessment  1. Able to understand medical problem? No  Comments: understands there is maybe a heart problem, but doesn't remember details    2. Able to understand proposed treatment? No    3. Able to understand alternative to proposed treatment (if any)? Not Applicable    4. Able to understanding option of refusing proposed treatment (including withholding or withdrawing proposed treatment)? No    5. Able to appreciate reasonably foreseeable consequence of accepting proposed treatment? No      6. Able to appreciate reasonable foreseeable consequences of refusing proposed treatment (including withholding or withdrawing proposed treatment)? No      7a. Is the patient's decision affected by depression? No      7b. Is the person's decision affected by delusions/psychosis? No      Overall impression: patient does not have capacity to make medical decisions.    Next of kin/surrogate decision maker if applicable: patient states he has no one in this world    Pt has failed to demonstrate ability to communicate a consistent choice and/or understand the relevant information and/or appreciate the situation and its consequences and/or reason about treatment options.

## 2016-03-18 MED ORDER — pantoprazole (PROTONIX) 40 MG tablet
40 | ORAL_TABLET | Freq: Every day | ORAL | 3 refills | Status: AC
Start: 2016-03-18 — End: ?
  Filled 2016-03-18: qty 30, 30d supply, fill #0

## 2016-03-18 MED ORDER — aspirin 81 MG EC tablet
81 | ORAL_TABLET | Freq: Every day | ORAL | 0 refills | 30.00000 days | Status: AC
Start: 2016-03-18 — End: ?
  Filled 2016-03-18: qty 30, 30d supply, fill #0

## 2016-03-18 MED ORDER — atorvastatin (LIPITOR) 80 MG tablet
80 | ORAL_TABLET | Freq: Every evening | ORAL | 0 refills | Status: AC
Start: 2016-03-18 — End: ?
  Filled 2016-03-18: qty 30, 30d supply, fill #0

## 2016-03-18 NOTE — Unmapped (Signed)
Patient refused assessment this morning saying all he wants is to go home. Will continue to monitor

## 2016-03-18 NOTE — Unmapped (Signed)
[facility]    Social Worker Discharge Summary     Patient name: Walter Duke                                        Patient MRN: [id number]  DOB: 03/18/1951                              Age: 65 y.o.              Gender: male  Patient emergency contact: Extended Emergency Contact Information  Primary Emergency Contact: No,Contact   [place]  Relation: None      Attending provider: No att. providers found  Primary care physician: No Pcp    The MD has indicated that the patient is ready for discharge.  Provided patient with list of city clinics. Patient prefers to make own appointment.  30 dollars in [place] coupons provided due to patient having no money until July 3rd. Bus Tokens provided.for transport.  List of homeless shelters provided.               The plan has been reviewed:                                     No further SW needs.    This plan has been reviewed with the multi-disciplinary team.

## 2016-03-18 NOTE — Unmapped (Signed)
Pt very upset; states that the MD told him that staff can take him outside to smoke and have coffee. [facility] smoking policy reinforced with the pt; states he will go across the street and smoke. Risks to leaving the hospital campus and his order for a medical hold explained. Pt remains adamant that he wants to go outside to smoke or leave AMA. Medical risks again explained; pt cursing and using obscene language stating the MDs do not know what they're doing! Floor RN paged cards team x2 and I placed page to [facility] and currently awaiting call back. Pt remains at the front desk repeatedly stating loudly that he wants to leave AMA.

## 2016-03-18 NOTE — Unmapped (Signed)
You came to the hospital because of chest pain and SOB. We did tests to make sure there was nothing serious going on with your heart, and all of the tests were OK. Your shortness of breath is most likely due to chronic obstructive pulmonary disorder (COPD), which can make it hard to breath. You should continue taking the inhalers (symbicort and incruse ellipta) daily as instructed to help with your shortness of breath. You can also use your albuterol inhaler if you still find it hard to breath. You should take the medication protonix once a day for your acid reflux. You should also continue to take aspirin and atorvastatin daily.    Chronic Obstructive Pulmonary Disease  Chronic obstructive pulmonary disease (COPD) is a common lung condition in which airflow from the lungs is limited. COPD is a general term that can be used to describe many different lung problems that limit airflow, including both chronic bronchitis and emphysema. If you have COPD, your lung function will probably never return to normal, but there are measures you can take to improve lung function and make yourself feel better.  CAUSES   ?? Smoking (common).  ?? Exposure to secondhand smoke.  ?? Genetic problems.  ?? Chronic inflammatory lung diseases or recurrent infections.  SYMPTOMS  ?? Shortness of breath, especially with physical activity.  ?? Deep, persistent (chronic) cough with a large amount of thick mucus.  ?? Wheezing.  ?? Rapid breaths (tachypnea).  ?? Wallace Cullens or bluish discoloration (cyanosis) of the skin, especially in your fingers, toes, or lips.  ?? Fatigue.  ?? Weight loss.  ?? Frequent infections or episodes when breathing symptoms become much worse (exacerbations).  ?? Chest tightness.  DIAGNOSIS  Your health care provider will take a medical history and perform a physical examination to diagnose COPD. Additional tests for COPD may include:  ?? Lung (pulmonary) function tests.  ?? Chest X-ray.  ?? CT scan.  ?? Blood tests.  TREATMENT   Treatment for  COPD may include:  ?? Inhaler and nebulizer medicines. These help manage the symptoms of COPD and make your breathing more comfortable.  ?? Supplemental oxygen. Supplemental oxygen is only helpful if you have a low oxygen level in your blood.  ?? Exercise and physical activity. These are beneficial for nearly all people with COPD.  ?? Lung surgery or transplant.  ?? Nutrition therapy to gain weight, if you are underweight.  ?? Pulmonary rehabilitation. This may involve working with a team of health care providers and specialists, such as respiratory, occupational, and physical therapists.  HOME CARE INSTRUCTIONS  ?? Take all medicines (inhaled or pills) as directed by your health care provider.  ?? Avoid over-the-counter medicines or cough syrups that dry up your airway (such as antihistamines) and slow down the elimination of secretions unless instructed otherwise by your health care provider.  ?? If you are a smoker, the most important thing that you can do is stop smoking. Continuing to smoke will cause further lung damage and breathing trouble. Ask your health care provider for help with quitting smoking. He or she can direct you to community resources or hospitals that provide support.  ?? Avoid exposure to irritants such as smoke, chemicals, and fumes that aggravate your breathing.  ?? Use oxygen therapy and pulmonary rehabilitation if directed by your health care provider. If you require home oxygen therapy, ask your health care provider whether you should purchase a pulse oximeter to measure your oxygen level at home.  ?? Avoid  contact with individuals who have a contagious illness.  ?? Avoid extreme temperature and humidity changes.  ?? Eat healthy foods. Eating smaller, more frequent meals and resting before meals may help you maintain your strength.  ?? Stay active, but balance activity with periods of rest. Exercise and physical activity will help you maintain your ability to do things you want to do.  ?? Preventing  infection and hospitalization is very important when you have COPD. Make sure to receive all the vaccines your health care provider recommends, especially the pneumococcal and influenza vaccines. Ask your health care provider whether you need a pneumonia vaccine.  ?? Learn and use relaxation techniques to manage stress.  ?? Learn and use controlled breathing techniques as directed by your health care provider. Controlled breathing techniques include:  ?? Pursed lip breathing. Start by breathing in (inhaling) through your nose for 1 second. Then, purse your lips as if you were going to whistle and breathe out (exhale) through the pursed lips for 2 seconds.  ?? Diaphragmatic breathing. Start by putting one hand on your abdomen just above your waist. Inhale slowly through your nose. The hand on your abdomen should move out. Then purse your lips and exhale slowly. You should be able to feel the hand on your abdomen moving in as you exhale.  ?? Learn and use controlled coughing to clear mucus from your lungs. Controlled coughing is a series of short, progressive coughs. The steps of controlled coughing are:  1. Lean your head slightly forward.  2. Breathe in deeply using diaphragmatic breathing.  3. Try to hold your breath for 3 seconds.  4. Keep your mouth slightly open while coughing twice.  5. Spit any mucus out into a tissue.  6. Rest and repeat the steps once or twice as needed.  SEEK MEDICAL CARE IF:  ?? You are coughing up more mucus than usual.  ?? There is a change in the color or thickness of your mucus.  ?? Your breathing is more labored than usual.  ?? Your breathing is faster than usual.  SEEK IMMEDIATE MEDICAL CARE IF:  ?? You have shortness of breath while you are resting.  ?? You have shortness of breath that prevents you from:  ?? Being able to talk.  ?? Performing your usual physical activities.  ?? You have chest pain lasting longer than 5 minutes.  ?? Your skin color is more cyanotic than usual.  ?? You measure low  oxygen saturations for longer than 5 minutes with a pulse oximeter.  MAKE SURE YOU:  ?? Understand these instructions.  ?? Will watch your condition.  ?? Will get help right away if you are not doing well or get worse.     This information is not intended to replace advice given to you by your health care provider. Make sure you discuss any questions you have with your health care provider.     Document Released: 06/16/2005 Document Revised: 09/27/2014 Document Reviewed: 05/03/2013  Elsevier Interactive Patient Education ??2016 [employer].

## 2016-03-18 NOTE — Unmapped (Signed)
Chaplains had request to see this patient.  As I talked with him, I learned he came here for pain in his chest.  He thought it was acid reflux.  He said he wants to leave, but the physician has put a medical hold on him.  I asked where he lives and he said he sleeps wherever he can find a spot.  He is homeless.  I asked about family and he said he doesn't have anyone now.  He hasn't seen his ex-wife and son since the son was [age] years old.  He seems hurt that neither the ex-wife nor son have anything to do with him.  He did ask if I could call them as he would like to talk to them again, just to see how they are doing.  I told him I would have to check on this.    We had a prayer together for healing and support.  He believes if he gets his SSN check on Monday, he can get an apartment for a month.  I asked, what if the doctor is right, and you do need treatment?  He replied, it is my life.  If I die, I die.  He showed me his Veterans card.  He also said, I just want to be left alone.  Is that too much to ask?  At this point I mentioned the Patient Relations department and gave him their phone number,j which he called.  He is looking for help to be able to leave.  I provided some support and prayer, but not all of what he is looking for.  Chaplain Elmo Putt, Stephannie Li., [facility]

## 2016-03-18 NOTE — Unmapped (Signed)
Walter Duke is a 65 y.o. male admitted on 03/16/2016 for chest pain and shortness of breath with ACS rule out. An assessment for capacity to refuse further evaluation and treatment has been deemed necessary as he has continued to refuse treatment and insist on leaving. He initially was not fully alert and oriented on admission and deemed not to have capacity so re-assessment was done.    Pt orientation: Self, location, month, year, date, situation    The below criteria are adapted from the  which can be found at http://capm.wikispaces.com/file/view/ACE.pdf    Capacity Assessment  1. Able to understand medical problem? Yes  Comments: States had chest pain but thinks it was just acid reflux acting up     2. Able to understand proposed treatment? Yes  Comments: none    3. Able to understand alternative to proposed treatment (if any)? Not Applicable  Comments:     4. Able to understanding option of refusing proposed treatment (including withholding or withdrawing proposed treatment)? Yes  Comments:     5. Able to appreciate reasonably foreseeable consequence of accepting proposed treatment? Yes  Comments: States it could help protect his heart    6. Able to appreciate reasonable foreseeable consequences of refusing proposed treatment (including withholding or withdrawing proposed treatment)? Yes  Comments: States he could die from his heart    7a. Is the patient's decision affected by depression? No  Comments: Denies thoughts of hurting self or being depressed    7b. Is the person's decision affected by delusions/psychosis? No  Comments: none    Overall impression: Pt has capacity    Next of kin/surrogate decision maker if applicable: None (states has no one)    Although he is angry about the situation, patient is able to communicate a choice, understand the relevant information, appreciate the situation and its consequences, and reason about treatment options.

## 2016-03-18 NOTE — Unmapped (Signed)
[facility] Cardiology Progress Note  03/18/2016  7:01 AM    Patient:  Walter Duke  MRN:   [id number]  Room:   4696/E9528    Date of Admit: 03/16/2016 LOS: 1 days  Attending Physician: Gayla Medicus, MD    Subjective:     Interval history: No acute events overnight.    Medications:  Scheduled Meds:  aspirin 81 mg Daily with breakfast   atorvastatin 80 mg Nightly (2100)   azithromycin 250 mg Daily 0900   heparin (porcine) 5,000 Units 3 times per day   IV fluid - SELECT ONE 1,000 mL Once   lactulose 20 g TID   pantoprazole 40 mg DAILY 0600   predniSONE 40 mg Daily 0900   sodium chloride 10 mL QS   Continuous Infusions: PRN medications: ipratropium-albuterol, nitroGLYCERIN     Objective:   Temp:  [97.8 ??F (36.6 ??C)-98.2 ??F (36.8 ??C)] 97.8 ??F (36.6 ??C)  Heart Rate:  [93-106] 106  Resp:  [20] 20  BP: (120-144)/(87) 120/87 mmHg     Intake/Output Summary (Last 24 hours) at 03/18/16 0701  Last data filed at 03/17/16 1905   Gross per 24 hour   Intake    840 ml   Output    200 ml   Net    640 ml       Fluid Status:  Admit Wt: Weight: 200 lb (90.719 kg)   Todays Wt: Weight: 200 lb (90.719 kg)  Hemodynamics last 24 hours and most recent         Physical Exam:  General: NAD.  Neck: Supple, no masses, no thyromegaly, no carotid bruits.  Lymph nodes: no cervical LAD appreciated  Cardiovascular: Sinus tachycardia, regular rhythm, normal S1/S2, no murmurs/rubs/gallops appreciated. No S3, S4 appreciated. Mild tenderness on palpation of chest wall. No prominent neck veins or JVD appreciated.  Pulmonary: B/l expiratory wheezes throughout lung fields, no rhonchi or crackles. Nml respiratory effort, chest wall movement symmetric, no use of accessory muscles.  Skin: no rashes or lesions  Abdomen: soft, non-tender, non-distended. No masses. No rebound or guarding. No hepatosplenomegaly. Audible bowel sounds.  Extremities: No cyanosis, clubbing or edema.   Neuro: AAOx3, CN II-XII grossly intact    Labs  Recent Labs      03/16/16   2235   03/17/16   0024  03/17/16   0235   TROPONINI  <0.04  <0.04  <0.04   BNP   --   58   --      Recent Labs      03/16/16   2134  03/17/16   0235   NA  140   --    K  3.7   --    CL  102   --    CO2  29   --    BUN  16   --    CREATININE  0.88   --    GLUCOSE  113*   --    CALCIUM  10.0   --    HGBA1C   --   5.9     Recent Labs      03/16/16   2134  03/16/16   2250   WBC  13.3*   --    HGB  16.6   --    HCT  49.8   --    PLT  228   --    INR   --   1.0   PROTIME   --  13.6     Recent Labs      03/16/16   2235   AST  25   ALT  14   ALKPHOS  69   BILITOT  0.5   BILIDIRECT  0.0     No results for input(s): CHOLTOT, TRIG, HDL, CHOLHDL, LDL in the last 72 hours.    Invalid input(s): VLDCHOL  No results for input(s): FOLATE in the last 72 hours.    Invalid input(s): VITAMINB1    Radiology last 24 hours:   X-ray Portable Chest    03/17/2016  Exam: XR PORTABLE CHEST Date: 03/17/2016 8:51 AM EDT CLINICAL HISTORY: Dyspnea, unspecified. TECHNIQUE: Portable AP view of the chest. COMPARISON: March 03, 2016 FINDINGS: Medical Devices: None. Heart and Mediastinum: Increased right paratracheal opacity, which may be related to superimposition of vascular or underlying adenopathy. Otherwise, the cardiomediastinal silhouette is unchanged. Lungs and Pleura: Bibasilar parenchymal opacities, increased. Bones: Unchanged.     03/17/2016  IMPRESSION: Increased bibasilar parenchymal opacities, which may be related to atelectasis and/or pneumonia, to include aspiration. Increased right paratracheal opacity which may be related to superimposition of vasculature or underlying adenopathy. Report Verified by: Blenda Peals, M.D. at 03/17/2016 9:36 AM EDT       Assessment & Plan:   Walter Duke is a 65 y.o. male on hospital day 1 w/a PMH sign for COPD, stroke (residual r-sided weakness, congitive impairness)who p/w SOB & weakness, workup has revealed no cardiac issues, currently on medical hold and being assessed for competency.    Today's Plan:  Will  coordinate with social work to determine competency, as it was determined he does not have capacity. He is currently on medical hold.    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

## 2016-03-18 NOTE — Unmapped (Signed)
Nurse reviewed discharge instructions with patient and he reluctantly accepted teaching and when nurse handed paperwork to him he put them in the trash saying putting in right where it belongs  Meds brought up from pharmacy but patient refused to take them saying he just wants to leave,pharmacist took meds back. Patient walked off unit at 1425 with all personal belongings.

## 2016-03-18 NOTE — Unmapped (Signed)
[facility]  Cardiology [facility] Discharge  Inpatient Discharge Summary    Patient: Walter Duke   MRN: [id number]   CSN: [id number]    Date of Admission: 03/16/2016  Date of Discharge: 03/18/2016   Admit Attending : Laurice Record, MD  Discharge Attending: Gayla Medicus, MD    Diagnoses Present on Admission     Past Medical History   Diagnosis Date   ??? COPD (chronic obstructive pulmonary disease)    ??? GERD (gastroesophageal reflux disease)    ??? Stroke         Discharge Diagnoses     Active Hospital Problems    Diagnosis Date Noted   ??? NSTEMI (non-ST elevated myocardial infarction) [I21.4] 03/17/2016   ??? Needs a medical hold [Z13.89] 03/17/2016      Resolved Hospital Problems    Diagnosis Date Noted Date Resolved   No resolved problems to display.       Reason for Admission     Walter Duke is a 65 y.o. male admitted for SOB and weakness.    Hospital Course     Mr. Hack is a 65 yo male w/a PMH significant for COPD, previous stroke (w/residual R-sided weakness and cognitive impairment), basilar tip aneurysm, chronic back pain who present 6/27 w/SOB and weakness of 2 days duration. Pt received breathing treatments in the ED. Pt started refusing treatments and lab draws 6/28. States he felt better and wanted to go home. Pt was assessed for capacity, was deemed not to have capacity 6/28 as he was oriented only to self and was unable to answer questions concerning his care. He was placed on a medical hold. Today (6/29) he was still refusing any treatment or labs/procedures, however he was AAOx3, and able to answer questions regarding his medical problems and consequences of refusing treatment appropriately, and was deemed to have capacity. Discharged 03/18/16.     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 (detailed more in Nancy's note)  -Was given medication through [facility] pharmacy    Discharge Physical Exam   BP 120/87 mmHg   Pulse 106   Temp(Src) 97.8 ??F (36.6 ??C) (Oral)   Resp 20   Wt 200 lb (90.719 kg)   SpO2 90%     Physical Exam:  General: AAOx3, disheveled, irritated gentleman  Unable to perform rest of exam as patient was not willing to participate    Core Measure Documentation (As Applicable)   Admit Wt: Weight: 200 lb (  90.719 kg)  Most Recent Wt: Weight: 200 lb (90.719 kg)     Core Measure Documentation  Was the Influenza Vaccine Screening Completed?: No, April-September Discharge  The core measures checklist is complete for discharge?: Yes      Procedures/Operations Performed     Notable Imaging Studies:  X-ray Portable Chest    03/17/2016  Exam: XR PORTABLE CHEST Date: 03/17/2016 8:51 AM EDT CLINICAL HISTORY: Dyspnea, unspecified. TECHNIQUE: Portable AP view of the chest. COMPARISON: March 03, 2016 FINDINGS: Medical Devices: None. Heart and Mediastinum: Increased right paratracheal opacity, which may be related to superimposition of vascular or underlying adenopathy. Otherwise, the cardiomediastinal silhouette is unchanged. Lungs and Pleura: Bibasilar parenchymal opacities, increased. Bones: Unchanged.      03/17/2016  IMPRESSION: Increased bibasilar parenchymal opacities, which may be related to atelectasis and/or pneumonia, to include aspiration. Increased right paratracheal opacity which may be related to superimposition of vasculature or underlying adenopathy. Report Verified by: Blenda Peals, M.D. at 03/17/2016 9:36 AM EDT      Cardiac Procedures/Imaging:  None    Surgeries:  None    Lines/Drains/Airways:  Patient Lines/Drains/Airways Status    Active Line / PIV Line     **None**                Consulting Services      IP CONSULT TO SOCIAL WORK  INPATIENT CONSULT TO PHARMACY FOR MEDICATION MANAGEMENT   IP CONSULT TO SOCIAL WORK  IP CONSULT TO SPIRITUAL CARE    Allergies     Allergies   Allergen Reactions   ??? Penicillins        Discharge Medications     Current Discharge Medication List      START taking these medications    Details   albuterol (PROVENTIL;VENTOLIN;PROAIR) 90 mcg/actuation inhaler Inhale 2 puffs into the lungs every 4 hours as needed for Wheezing or Shortness of Breath.  Qty: 18 g, Refills: 0    Comments: COPD discharge      aspirin 81 MG EC tablet Take 1 tablet (81 mg total) by mouth daily with breakfast.  Qty: 30 tablet, Refills: 0      atorvastatin (LIPITOR) 80 MG tablet Take 1 tablet (80 mg total) by mouth at bedtime.  Qty: 30 tablet, Refills: 0      budesonide-formoterol (SYMBICORT) 160-4.5 mcg/actuation inhaler Inhale 2 puffs into the lungs 2 times a day.  Qty: 10.2 g, Refills: 0    Comments: COPD discharge      pantoprazole (PROTONIX) 40 MG tablet Take 1 tablet (40 mg total) by mouth daily.  Qty: 30 tablet, Refills: 3      umeclidinium (INCRUSE ELLIPTA) 62.5 mcg/actuation DsDv Inhale 1 puff (62.5 mcg total) into the lungs daily.  Qty: 30 each, Refills: 0    Comments: COPD discharge             Condition on Discharge     1. Functional Status: normal    2. Mental Status: Alert/Oriented    3. Dietary Restrictions / Tube Feeding / TPN: No   If yes, describe:     4. Supplemental Oxygen /  Ventilation / CPAP / Bi-Level: No   If yes, describe settings/delivery:      5. In-dwelling Lines or Catheters: No  If yes, describe    Disposition     Homeless; was given counseling on available resources, refusing any other assistance.    Follow-Up Appointments     None scheduled, was provided a list of walk-in  clinic    Specific Follow-Up Items for Receiving Physician     Primary Care Physician  No Pcp  [address] [place] [address]  None    1. Was given list of walk-in clinics .     Signed:  Dennison Nancy, MD  03/18/2016, 2:27 PM

## 2016-03-18 NOTE — Unmapped (Signed)
Physical Therapy/Occupational Therapy   Reason Patient Not Seen         Name: Walter Duke  DOB: [birth year]  Attending Physician: Gayla Medicus, MD  Admission Diagnosis: Unstable angina [I20.0]  Date: 03/18/2016    Reviewed Pertinent hospital course: Yes   Time of attempt: 11:50 AM     Therapist observed patient independently ambulating in hallway this AM with no AD.     Therapist attempted to see patient for tx this AM with focus on higher level balance tasks; however patient with +agitation & REFUSING participation in therapy session at this time 2/2 r/o being held here against my will. What is a medical hold! Therapist attempted to provide patient with emotional support however he continues to refuse participation. PT POC dec'd to 1-3x/week & therapist will f/u with patient as schedule permits, per POC & as he is medically appropriate. Thank you.       Rolm Gala. Valla Leaver, DPT  Physical Therapist  Pager: [phone]  Office: [phone]  Shift: 7:30AM-4:00PM Monday-Friday

## 2016-03-28 ENCOUNTER — Inpatient Hospital Stay: Admit: 2016-03-28 | Discharge: 2016-03-28 | Disposition: A | Discharge status: Home / Self Care | Payer: MEDICARE

## 2016-03-28 DIAGNOSIS — M79651 Pain in right thigh: Secondary | ICD-10-CM

## 2016-03-28 LAB — BASIC METABOLIC PANEL
Anion Gap: 6 mmol/L (ref 3–16)
BUN: 14 mg/dL (ref 7–25)
CO2: 28 mmol/L (ref 21–33)
Calcium: 9.5 mg/dL (ref 8.6–10.3)
Chloride: 103 mmol/L (ref 98–110)
Creatinine: 0.83 mg/dL (ref 0.60–1.30)
Glucose: 97 mg/dL (ref 70–100)
Osmolality, Calculated: 284 mosm/kg (ref 278–305)
Potassium: 4.1 mmol/L (ref 3.5–5.3)
Sodium: 137 mmol/L (ref 133–146)
eGFR AA CKD-EPI: >90 See note.
eGFR NONAA CKD-EPI: >90 See note.

## 2016-03-28 LAB — DIFFERENTIAL
Basophils Absolute: 151 /uL (ref 0–200)
Basophils Relative: 1.2 % (ref 0.0–1.0)
Eosinophils Absolute: 202 /uL (ref 15–500)
Eosinophils Relative: 1.6 % (ref 0.0–8.0)
Lymphocytes Absolute: 1915 /uL (ref 850–3900)
Lymphocytes Relative: 15.2 % (ref 15.0–45.0)
Monocytes Absolute: 1260 /uL (ref 200–950)
Monocytes Relative: 10 % (ref 0.0–12.0)
Neutrophils Absolute: 9072 /uL (ref 1500–7800)
Neutrophils Relative: 72 % (ref 40.0–80.0)
nRBC: 0 /100 WBC (ref 0–0)

## 2016-03-28 LAB — APTT: aPTT: 29.7 seconds (ref 25.5–35.0)

## 2016-03-28 LAB — PROTIME-INR
INR: 1.1 (ref 0.9–1.1)
Protime: 14.2 seconds (ref 11.6–14.4)

## 2016-03-28 LAB — CBC
Hematocrit: 41.3 % (ref 38.5–50.0)
Hemoglobin: 13.7 g/dL (ref 13.2–17.1)
MCH: 29.3 pg (ref 27.0–33.0)
MCHC: 33.3 g/dL (ref 32.0–36.0)
MCV: 88.1 fL (ref 80.0–100.0)
MPV: 8.4 fL (ref 7.5–11.5)
Platelets: 217 10E3/uL (ref 140–400)
RBC: 4.69 10E6/uL (ref 4.20–5.80)
RDW: 16.2 % — ABNORMAL HIGH (ref 11.0–15.0)
WBC: 12.6 10E3/uL — ABNORMAL HIGH (ref 3.8–10.8)

## 2016-03-28 MED ORDER — acetaminophen (TYLENOL) tablet 650 mg
325 | Freq: Once | ORAL | Status: AC
Start: 2016-03-28 — End: 2016-03-28
  Administered 2016-03-28: 20:00:00 650 mg via ORAL

## 2016-03-28 MED ORDER — ibuprofen (ADVIL,MOTRIN) 800 MG tablet
800 | ORAL_TABLET | Freq: Three times a day (TID) | ORAL | Status: AC | PRN
Start: 2016-03-28 — End: ?

## 2016-03-28 MED FILL — TYLENOL 325 MG TABLET: 325 325 mg | ORAL | Qty: 2

## 2016-03-28 NOTE — Unmapped (Signed)
Patient states he recently had a heart stent placed.  Not sure of exact date or place of stent placement.  States his heart feels better, states his right thigh, groin, is bruised, swollen and painful

## 2016-03-28 NOTE — Unmapped (Signed)
ED Attending Attestation Note    Date of service:  03/28/2016    This patient was seen by the resident physician.  I have seen and examined the patient, agree with the workup, evaluation, management and diagnosis. The care plan has been discussed and I concur.     My assessment reveals a 65 y.o. male presents to the emergency department with swelling right groin.  He was seen at outside hospital yesterday where they did a in ultrasound which was negative other than a hematoma as below:  VASCI-ART DUPLEX LE LTD RT7/04/2016   [facility]   VASCI-ART DUPLEX LE LTD RT7/04/2016   [facility]   Result Narrative   FINAL IMPRESSIONS  1. Duplex scan reveals no evidence of pseudoaneurysm or arteriovenous   fistula at the right groin.  2.  An apparent hematoma is noted at the right groin measuring 4 cm x 6 cm   x 2 cm cm in diameter.  Full report view including tables and additional findings is available in   the link below.     .      On exam patient with ecchymoses right groin extending into medial thigh.  No bruits auscultated.  Dorsalis pedis 2+ and affected extremity.

## 2016-03-28 NOTE — Unmapped (Signed)
You were evaluated today in the emergency department for leg pain and bruising.  The cause of this was a procedure completed 10 days ago at the [facility].  It appears that you have a hematoma on your leg.  He may take ibuprofen or Tylenol for pain relief.      Return to the emergency department for worsening of your current symptoms, developing a fever greater than 101.5 that is not reduced with Tylenol, or if you develop new concerning symptoms      Do you need a primary care physician?    Primary care physicians (PCP) most people know what they are, but many do not see the need in having one. People do not realize that PCPs are an integral part of the healthcare spectrum and are playing an ever-increasing role in wellness and disease prevention. So, why is it so important to have a PCP? Here are just a few reasons:    Prevention: Scheduling regular check-ups with your PCP can keep you up-to-date on all preventative wellness. Annual visits for a physical and other diagnostics can determine early signs of a possible condition that may require more care. Annual visits can detect diseases such as diabetes mellitus (high blood sugar), high cholesterol, high blood pressure, COPD, depression, and can perform screening tests to help catch early forms of cancer before they become untreatable.    Everyday health concerns: For common health issues or minor illnesses (like a cold of flu), a PCP is more than likely all you need to get well. A PCP really is a one stop shop; there is no need to run around to various specialists for common health concerns, as PCPs are general practitioners and have a vast knowledge of all conditions.   Evaluate the severity of your condition: In most cases, your health problems will not be severe and can be addressed by your PCP. But in the event that you need more focused care, your PCP can recommend you to a specialist.    They know your history: Since you see your PCP on a regular  basis, they know your medical background. This gives them the ability to look at all aspects of your health rather than just one incident. By knowing your medical history as well as your family's, PCPs have a better understanding of your overall health status and can provide you with a medical plan that is tailored to your specific profile.    I strongly recommend that you look into establishing a relationship with a primary care physician. Medical care is best given by a doctor who knows you and your medical history. Therefore, it is best to have a personal physician. Please choose a physician and call him/her for periodic health checks and when you are ill. Below is a list of clinics in the area that could provide you primary care. Call one in your area to find out if they have any restrictions and to set up and appointment.      [facility]  [facility]  [address] [address]  [phone]  Fax [phone]  Medical, OB/Gyn, Pediatrics, WIC  Serves all of [facility])  [address]  [place], [place] [address]  [phone]       [facility]  [address] (Administrative offices)  [phone]  Homeless only [facility])  [address], [place], [place] [address]  [phone]  or [phone], Spanish [phone],   Dental Appointments [phone] or [phone]  Pediatric, Family Practice, X-Ray  Serves all of [facility])  [address]  [place], [place] [address]  [phone]   [facility] [phone]. Healthy)   [address]    (Located in [place])  [phone] or [phone], Spanish [phone],   Dental Appointments [phone] or [phone]     [facility]  [address] [place], [place] [address]  [phone] [facility]  [address]  [phone]   [facility]  [address]  [address]  [phone] Fax [phone]  General Practice    [facility] and Surrounding areas [facility]  [address] [address]  [phone] Fax [phone]  Medical, OB/Gyn, Pediatrics  Dental Clinic, [facility] limits only     [facility]  [address] [address]  [phone] Fax [phone]  [facility], Pediatrics, [place], [facility]  Dental Clinic [phone]  [facility] area   [facility]  [address]    [phone]  Urgent Care, Open 24 hrs, Urgent care, Gyn, Prenatal, Dental Mental, Translators     [facility]   [address] [place], [place] [address] [phone]  (Located: [facility])  Pediatrics [phone], Spanish [phone],   Dental Appointments [phone] or [phone]  [facility] [phone] [facility]  [address] [place]. [address]  [phone]  Medical, OB/Gyn, Pediatrics, [facility]  Dental Clinic [phone]       [facility]  [address], [place], [place]  [address]  [phone] Fax [phone]  Pediatrics, [facility]   [facility]  [address] [address]  [phone]   Fax [phone]  Pediatrics, [facility]     [facility]  [address] [address]  [phone]  Medical Clinic   [facility]  [address] [address]  [phone]  Pediatrics, Internal Med, [place], [facility], Dental Clinic [phone]     [facility]  [address]  [place] [address]   [phone] [facility]  [address] [address]  [phone] Fax [phone]  General Medical Clinic  Sliding scale fee  All [place] area     HOSPITAL CLINICS    [facility]  [address] [facility]  [place], [place] [address]  [phone]     [facility]  [address]  [place], [place] [address]  [phone]    [facility]   [address]  [place], [place] [address]  [phone]         [facility] Medical Subspecialties  [address]  [place], [place] [address]  [facility]  [phone]  Fax:  [phone]  [facility] Internal Medicine and Pediatrics  [phone]  Fax:  [phone]  General Medical Clinic  Resident Practice  [phone]  Fax:  [phone]  Medical Specialty Center  [phone]  Fax:  [phone]  Orthopaedics  [phone]     [facility] Source of [place])  [address]  [place], [place] [address]  [phone]    [place] (Continued)    [facility]   [facility] Source of [place])  [address]  [place], [place] [address]  [phone]   [facility]  [facility] Source of [place]   (formerly [facility])   [address]  [place], [place] [address]  [phone]  [phone] OB/GYN Services   [facility]   [facility] Source of [place])  [address]  [place], [place] [address]  [phone]       [facility]  [facility] Source of [place])  [phone] [place]. [address], [place] [address]  [phone] [employer]    [facility]  (Health Source of [place])  [address] [place], [place] [address]  [phone]     [facility]  [facility] Source of [place])  [address] [place], [place] [address]  [phone]   [facility]    [facility] (Health Source of [place])  [address] [place], [place] [address]  [phone]    [facility]  [facility]  [name], [place], [place] [address]  [phone]  General Family Practice, Pediatrics  Services all areas sliding scale fee     [facility]  [place]  [facility]  [address] [place], [place]  [address]  [phone]  General Medical Clinic, Pediatrics, [facility]   [facility]   ([facility])  [address]  [address]  [place], [place] [address]  [phone]   [facility] [employer] [facility])  [address]  [place], [place] [address]  [phone]     [place]  [facility] (Health Source of [place])  [address]   [facility]  [phone]  Gyn, Prenatal, Dental, Mental, Translators [place]  [facility].  [place]   [phone]

## 2016-03-28 NOTE — Unmapped (Signed)
Pt  Being discharged with rx x1

## 2016-03-28 NOTE — Unmapped (Signed)
Patient on bedside monitor.

## 2016-03-28 NOTE — Unmapped (Signed)
[name]    Date of Service: 03/28/2016    Reason for Visit: Leg Pain      Patient History     HPI:  This is a 65 y.o. male with PMH as noted below presenting with right thigh pain and bruising.  He states that his leg has been painful with significant bruising since his heart procedure a few weeks ago.  He does not know the exact date when he had his procedure believes that he had it completed at [facility].  He admits to significant pain in his right upper thigh that radiates down his leg and makes it difficult for him to flex his hip and walk.  The bruising that he noticed after the procedure has increased in severity.  He said that a hospital marked orders of his bruising the other day, and now the bruises extend past the marks.  He currently denies chest pain or shortness of breath, and states that the procedure made his chest pain completely resolve.  He denies recent fever, change in sensation distal to the bruising, abdominal pain, or swelling of his bilateral lower legs.    With the exception of the above, there are no aggravating or alleviating factors.    Past Medical History   Diagnosis Date   ??? COPD (chronic obstructive pulmonary disease)    ??? GERD (gastroesophageal reflux disease)    ??? Stroke        Past Surgical History   Procedure Laterality Date   ??? Hernia repair         Walter Duke  reports that he has been smoking Cigarettes.  He has been smoking about 0.50 packs per day. He does not have any smokeless tobacco history on file. He reports that he does not drink alcohol. His drug history is not on file.    Patient's Medications   New Prescriptions    IBUPROFEN (ADVIL,MOTRIN) 800 MG TABLET    Take 1 tablet (800 mg total) by mouth every 8 hours as needed for Pain.   Previous Medications    ALBUTEROL (PROVENTIL;VENTOLIN;PROAIR) 90 MCG/ACTUATION INHALER    Inhale 2 puffs into the lungs every 4 hours as needed for Wheezing or Shortness of  Breath.    ASPIRIN 81 MG EC TABLET    Take 1 tablet (81 mg total) by mouth daily with breakfast.    ATORVASTATIN (LIPITOR) 80 MG TABLET    Take 1 tablet (80 mg total) by mouth at bedtime.    BUDESONIDE-FORMOTEROL (SYMBICORT) 160-4.5 MCG/ACTUATION INHALER    Inhale 2 puffs into the lungs 2 times a day.    PANTOPRAZOLE (PROTONIX) 40 MG TABLET    Take 1 tablet (40 mg total) by mouth daily.    UMECLIDINIUM (INCRUSE ELLIPTA) 62.5 MCG/ACTUATION DSDV    Inhale 1 puff (62.5 mcg total) into the lungs daily.   Modified Medications    No medications on file   Discontinued Medications    No medications on file       Allergies:   Allergies as of 03/28/2016 - Fully Reviewed 03/28/2016   Allergen Reaction Noted   ??? Penicillins  11/25/2015       PMH: Nursing notes reviewed   PSH: Nursing notes reviewed   FH: Nursing notes reviewed   MEDS: Nursing notes and chart reviewed         Review of Systems     ROS:  A full 10-point review of systems was conducted and is otherwise  negative unless noted above in HPI.    Physical Exam     Filed Vitals:    03/28/16 1405   BP: 132/87   Pulse: 108   TempSrc: Oral   Resp: 22   SpO2: 94%       General:  well nourished; well developed; in no apparent distress     HEENT:  normocephalic, atraumatic; pupils equal, round and reactive; sclera anicteric; conjunctiva pink; moist mucous membranes; no oropharyngeal erythema or mucosal lesions     Neck:  no meningismus; trachea midline     Pulmonary:   lung sounds clear to auscultation bilaterally with good air entry; no respiratory distress; no wheezes or rales     Cardiac:  regular rate and rhythm with no murmurs, rubs, or gallops     Abdomen:  soft; non-tender, non-distended; no guarding    Musculoskeletal:  atraumatic exam with no focal swelling or tenderness, no peripheral edema  Ecchymosis of the right groin extending to the mid anterior thigh, extending past the previously marked borders, no associated swelling or warmth, AP pulses equal and strong  bilaterally, slight weakness with dorsi and plantar flexion of the right ankle in comparison to the left, hip flexion intact on the right, but decreased strength due to pain, sensation intact in bilateral lower extremities    Vascular:  2+ peripheral pulses in bilateral upper lower extremities     Skin:  warm and well perfused without rashes or lesions     Neuro:  alert and oriented x 3; normal gait; strength and sensation grossly intact     Psych:  appropriate mood and affect         Diagnostic Studies     Labs: The attending and I have reviewed laboratory findings.  Labs were reviewed and and significant values identified.    Please see electronic medical record for any tests performed in the ED     Labs Reviewed   CBC - Abnormal; Notable for the following:     WBC 12.6 (*)     RDW 16.2 (*)     All other components within normal limits   DIFFERENTIAL - Abnormal; Notable for the following:     Basophils Relative 1.2 (*)     Neutrophils Absolute 9072 (*)     Monocytes Absolute 1260 (*)     All other components within normal limits   BASIC METABOLIC PANEL    Narrative:     As of 11/19/2014 the estimated GFR is calculated from serum creatinine using the Chronic Kidney Disease  Epidemiology Collaboration (CKD-EPI) equation in patients 18 years and older.  The reference range is   >60 mL/min/1.51m2.  eGFR values greater than 90 will be reported as >54mL/min/1.73m2.  Reference: Cherie Dark AS, [facility]ella [facility], Stefano Gaul AF, 3rd, Feldman HI, et. al.  A new equation to estimate glomerular filtration rate.  Ann Intern Med. 2009:150(9):604-12   PROTIME-INR   APTT       IMAGING STUDIES / RADIOLOGY: The attending and I have reviewed radiographic imaging.  Imaging studies were reviewed and and significant values identified.    Please see electronic medical record for any tests performed in the ED    No orders to display       Emergency Department Procedures   None    ED Course and MDM     Vital signs, medical history,  social history, allergies and nursing notes reviewed.    Vitals:  BP 132/87 mmHg  Pulse 108   Resp 22   SpO2 94%    Briefly this is a is a 65 y.o. male who presents to the emergency department with right leg ecchymosis and pain s/p cardiac catheterization 03/18/16 At [facility].  Patient presented afebrile with normal vitals.  Patient was in no acute distress, nontoxic and non-hypoxic.  Patient was able to complete full sentences at bedside.  Patient was not using accessory muscles.  Patient was able to cooperate with history and physical exam.  Patient's previous charts, labs and imaging was reviewed.  Please see HPI and physical for further details.    The patient presented to the emergency department for evaluation of right groin pain and bruising.  He is status post cardiac catheterization on 03/18/16, and his bruising and pain has progressively worsened.  He is currently homeless, and appears to be confused about his past medical history.  He believes that he had the catheterization at our hospital, and states that a doctor marked the edges of his bruising on his legs, but he does not know where.  Her everywhere review was completed showing that the patient was evaluated at the [facility] yesterday for similar groin pain and bruising.  An arterial duplex was completed of the right lower extremity showing no evidence of pseudoaneurysm or anterior venous fistula at the right groin.  It showed an apparent hematoma in the right groin measuring 4 cm x 16 m x 2 cm in diameter.  At this time, further imaging does not need to be repeated as the patient's hemoglobin was 13.7 yesterday, and has remained stable at 13.7.  He had a slight leukocytosis of 12.6 yesterday, which has stayed constant.  I have a low suspicion for compartment syndrome from the hematoma.  The patient was able to ambulate without assistance, and bear weight on the leg.  He has remained afebrile, and has no surrounding warmth or swelling.   My suspicion for secondary infection is low.  Repeat imaging or further intervention is not warranted at this time based off of history, physical exam findings, and previous images completed yesterday at an outside hospital.    03/27/2016  VASCI-ART DUPLEX LE LTD RT7/04/2016   [facility]   Result Narrative   FINAL IMPRESSIONS  1. Duplex scan reveals no evidence of pseudoaneurysm or arteriovenous   fistula at the right groin.  2.  An apparent hematoma is noted at the right groin measuring 4 cm x 6 cm   x 2 cm cm in diameter.  Full report view including tables and additional findings is available in   the link below.     WBC 12.6* 03/27/2016   HGB 13.7 03/27/2016   HCT 38.7 03/27/2016   MCV 86.7 03/27/2016   PLT 214 03/27/2016     At this time, the patient is appropriate for discharge as.  He was given strict instructions to return for worsening of current symptoms are developing of new concerning symptoms.        The patient was seen and evaluated by the attending physician Dr. Alycia Rossetti who agreed with the assessment and plan.  The patient and / or the family were informed of the results of any tests, a time was given to answer questions, a plan was proposed and they agreed with plan.     Risks, benefits, and alternatives were discussed. At this time the patient has been deemed safe for discharge. My customary discharge instructions including strict return precautions for worsening  or new symptoms have been communicated.  Please see AVS for further details.      Consults:  None    Medications administered in the ED:    Medications - No data to display    Impression     1. Pain of right lower extremity    2. Hematoma         Plan     1. The patient is to be discharged home in stable/improved condition.  2. Workup, treatment and diagnosis were discussed with the patient and/or family members; the patient agrees to the plan and all questions were addressed and answered.  3. The patient is instructed to return to the  emergency department should his symptoms worsen or any concern he believes warrants acute physician evaluation.  4. The patient will be discharged on the following medications, which were discussed with the patient:   - None            [facility], PA  03/28/16 1515

## 2016-09-20 DEATH — deceased

## 2016-11-12 IMAGING — CR DG CHEST 2V
2 series · 2 of 2 positions shown · non-contrast
Comparison: None in PACs

CLINICAL DATA: Two days of cough and weakness associated with
dysuria which began 1 day ago; history of COPD, current smoker

EXAM:
CHEST  2 VIEW

[chest pa]
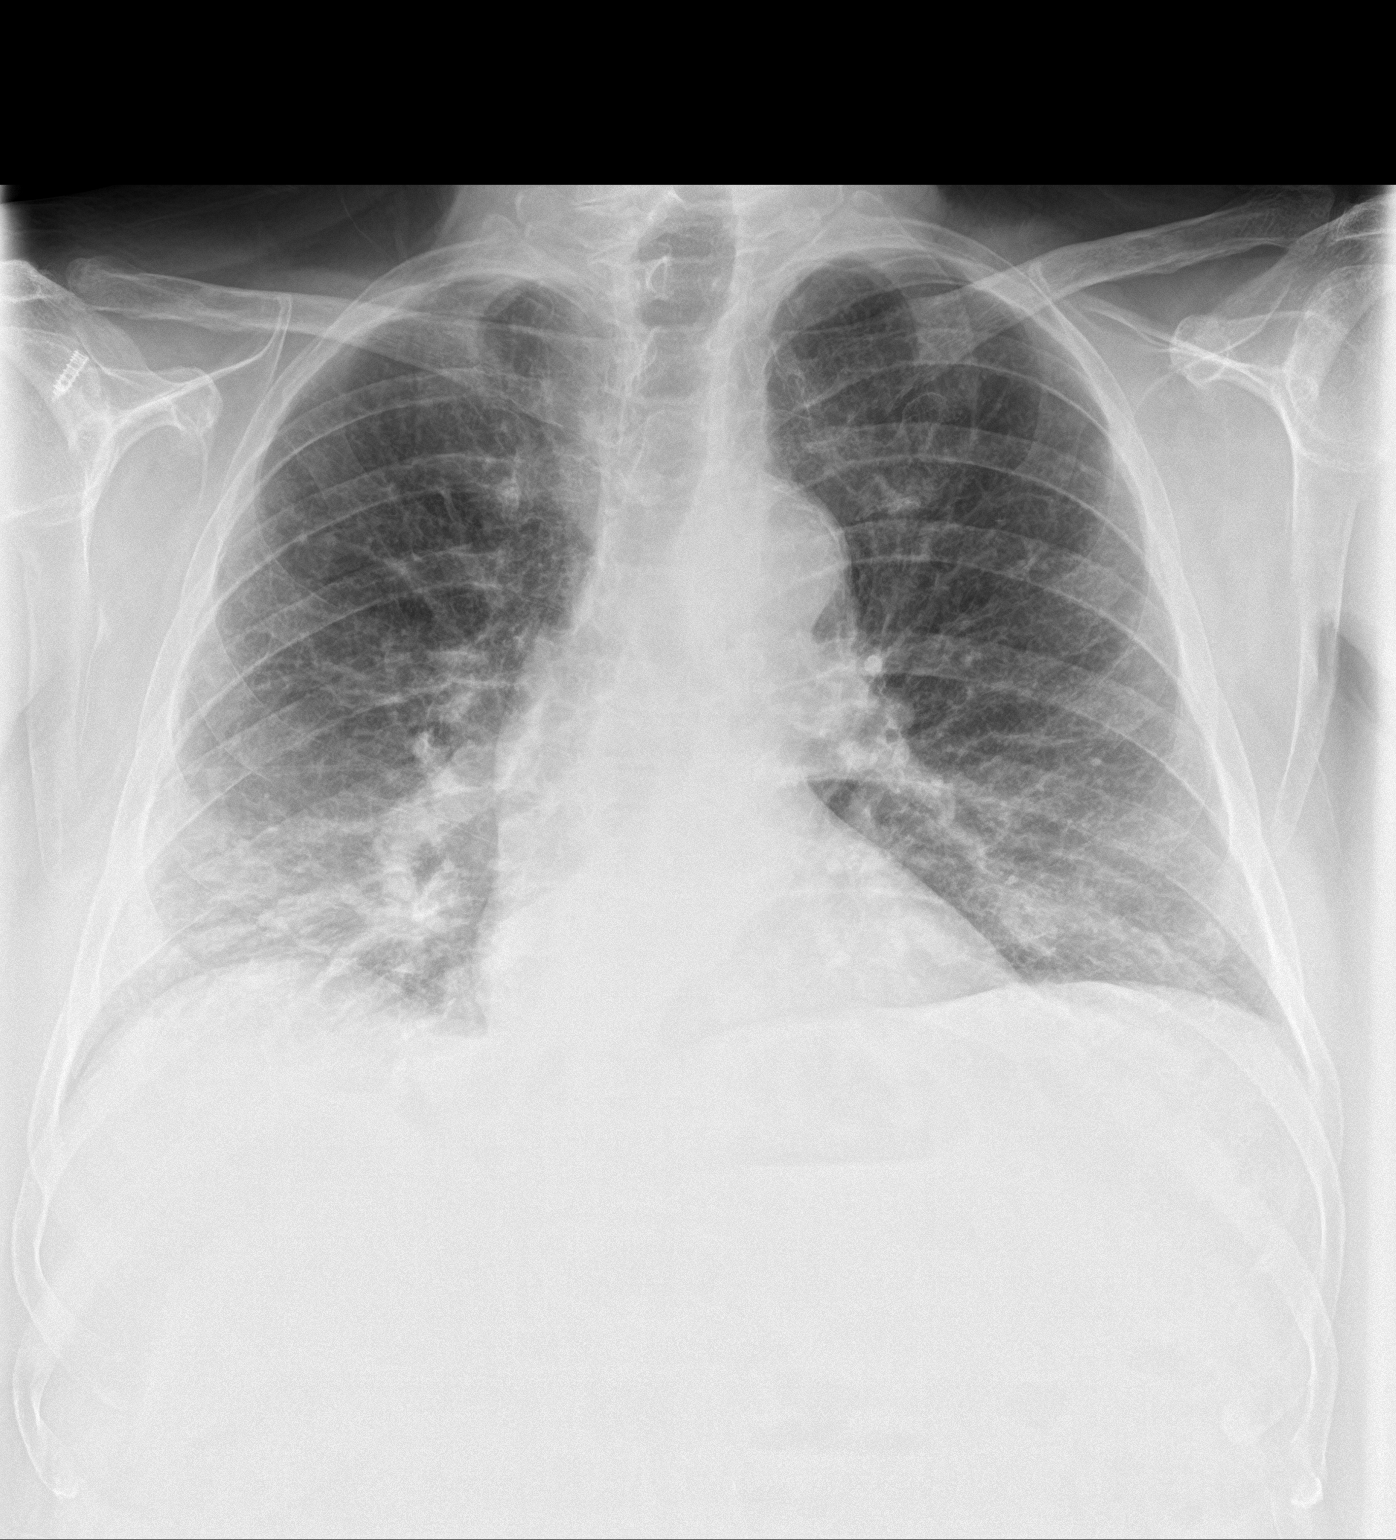

[chest lat]
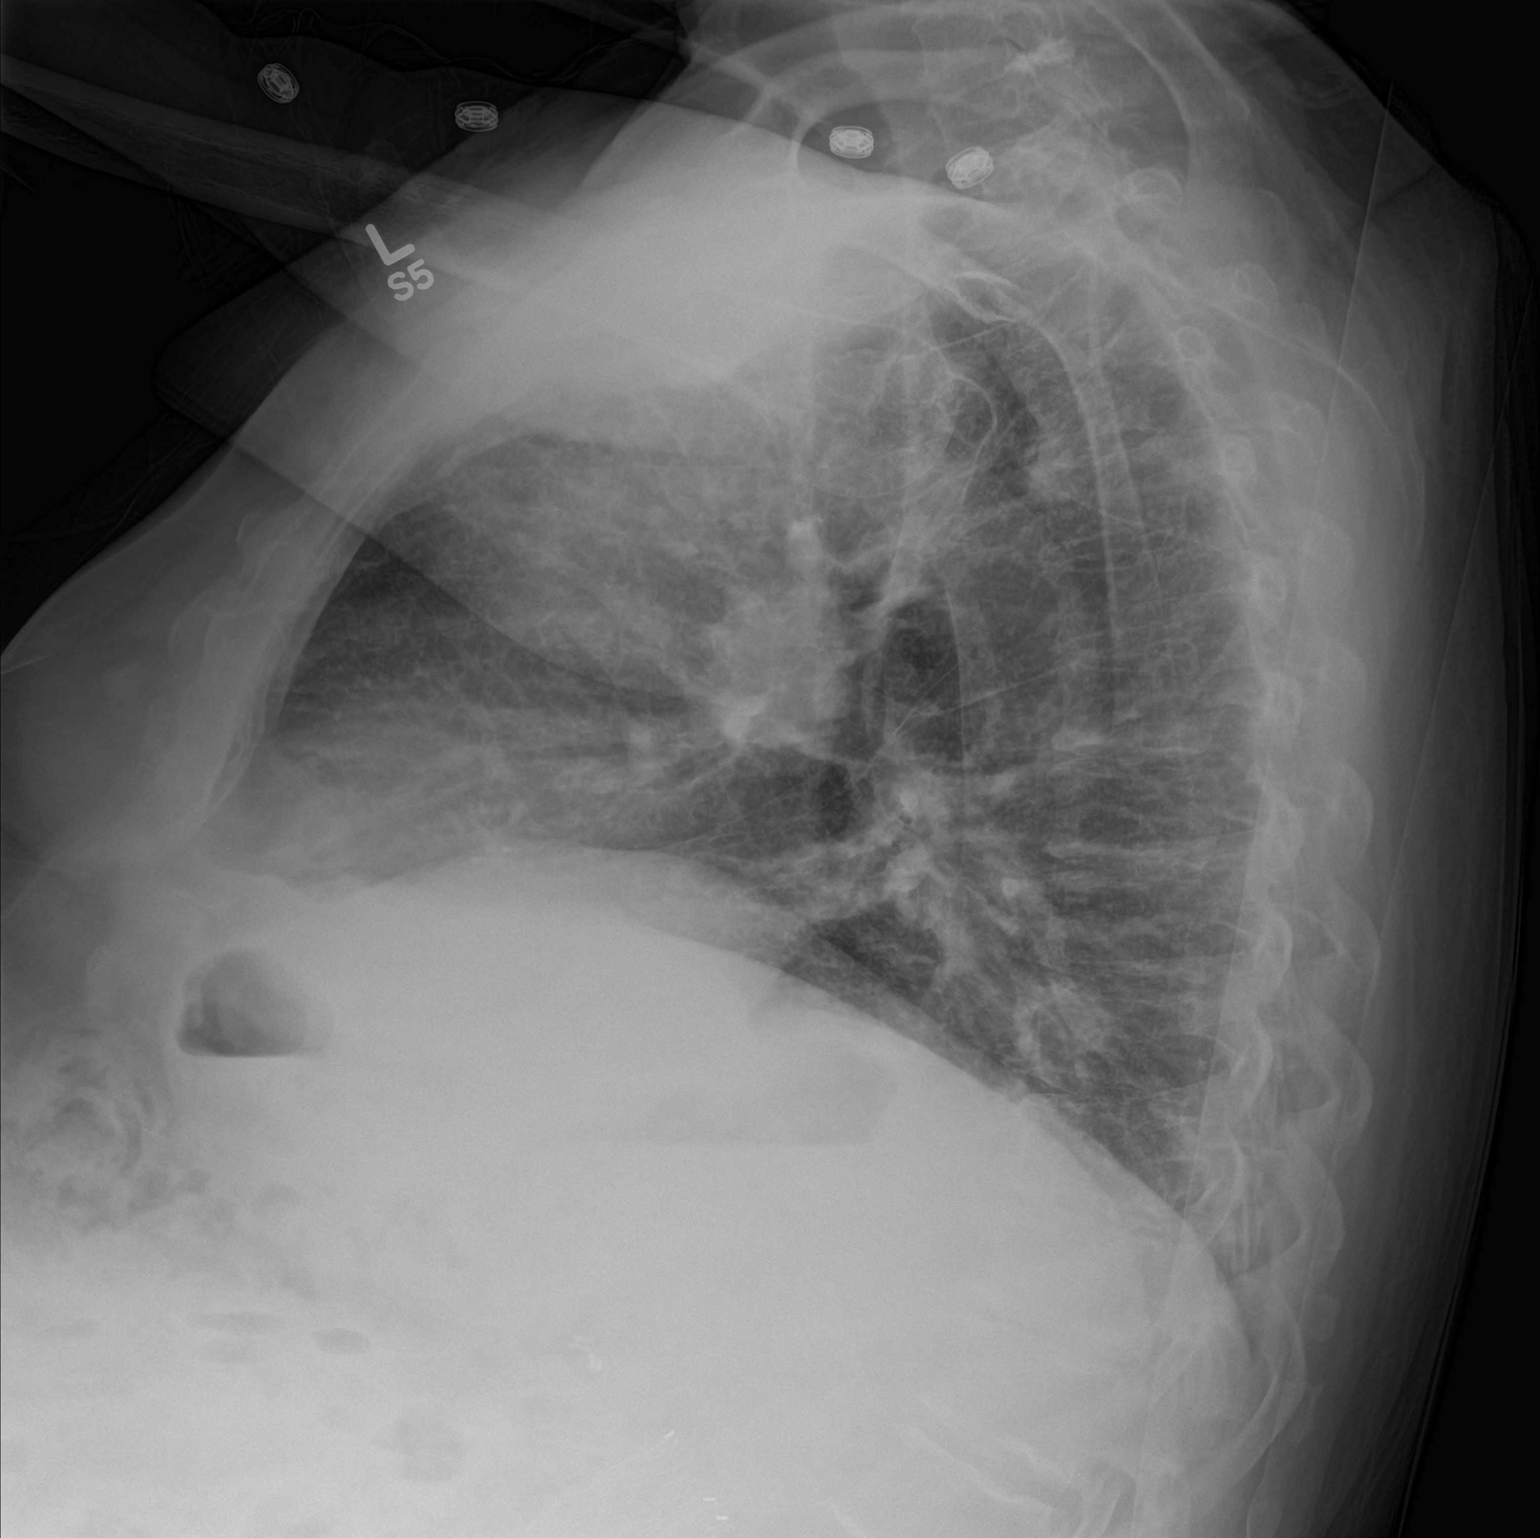

[2 of 2 positions shown; findings below may reference images not displayed]

FINDINGS: The lungs are adequately inflated. There are coarse interstitial
infiltrates in the infrahilar regions bilaterally. There are no air
bronchograms. There is no pleural effusion. The heart and pulmonary
vascularity are normal. There is tortuosity of the descending
thoracic aorta.
IMPRESSION: Coarse interstitial densities at both lung bases worrisome for
pneumonia.

Followup PA and lateral chest X-ray is recommended in 3-4 weeks
following trial of antibiotic therapy to ensure resolution and
exclude underlying malignancy.
